# Patient Record
Sex: Female | Born: 1990 | Race: White | Hispanic: No | Marital: Single | Smoking: Former smoker
Health system: Southern US, Community
[De-identification: ages and names within clinical notes are randomized; demographics above are authoritative.]

## PROBLEM LIST (undated history)

## (undated) DIAGNOSIS — M545 Low back pain, unspecified: Secondary | ICD-10-CM

## (undated) DIAGNOSIS — G8929 Other chronic pain: Secondary | ICD-10-CM

## (undated) DIAGNOSIS — M1990 Unspecified osteoarthritis, unspecified site: Secondary | ICD-10-CM

## (undated) DIAGNOSIS — M779 Enthesopathy, unspecified: Secondary | ICD-10-CM

## (undated) HISTORY — DX: Enthesopathy, unspecified: M77.9

## (undated) HISTORY — PX: OTHER SURGICAL HISTORY: SHX169

## (undated) HISTORY — DX: Other chronic pain: G89.29

## (undated) HISTORY — DX: Low back pain: M54.5

## (undated) HISTORY — DX: Unspecified osteoarthritis, unspecified site: M19.90

## (undated) HISTORY — DX: Low back pain, unspecified: M54.50

---

## 2012-07-28 LAB — OB RESULTS CONSOLE ABO/RH: RH Type: POSITIVE

## 2012-07-28 LAB — OB RESULTS CONSOLE HEPATITIS B SURFACE ANTIGEN: Hepatitis B Surface Ag: NEGATIVE

## 2012-09-23 NOTE — L&D Delivery Note (Signed)
Attestation of Attending Supervision of Advanced Practitioner: Evaluation and management procedures were performed by the PA/NP/CNM/OB Fellow under my supervision/collaboration. Chart reviewed and agree with management and plan.  Katerin Negrete V 03/16/2013 9:50 PM

## 2012-09-23 NOTE — L&D Delivery Note (Signed)
Delivery Note At 1:18 AM a viable and healthy female was delivered via Vaginal, Spontaneous Delivery (Presentation: Middle Occiput Posterior).  No difficulty with shoulders.  APGAR: 8, 9; weight 6 lb 0.8 oz (2744 g).   Placenta status: Intact, Spontaneous.  Cord: 3 vessels with the following complications: None.   Anesthesia: Epidural  Episiotomy: None Lacerations: 1st degree Suture Repair: none needed, laceration not bleeding Est. Blood Loss (mL): 250  Mom to postpartum.  Baby to nursery-stable.  [facility] 03/14/2013, 9:47 AM

## 2012-12-02 ENCOUNTER — Inpatient Hospital Stay (HOSPITAL_COMMUNITY): Admission: AD | Admit: 2012-12-02 | Payer: Self-pay | Source: Ambulatory Visit | Admitting: Obstetrics and Gynecology

## 2012-12-28 ENCOUNTER — Encounter: Payer: Self-pay | Admitting: Obstetrics and Gynecology

## 2012-12-28 ENCOUNTER — Other Ambulatory Visit: Payer: Medicaid Other | Admitting: Obstetrics and Gynecology

## 2012-12-28 ENCOUNTER — Ambulatory Visit (INDEPENDENT_AMBULATORY_CARE_PROVIDER_SITE_OTHER): Payer: Medicaid Other | Admitting: Obstetrics and Gynecology

## 2012-12-28 VITALS — BP 112/80 | Wt 147.4 lb

## 2012-12-28 VITALS — BP 112/80

## 2012-12-28 DIAGNOSIS — F1920 Other psychoactive substance dependence, uncomplicated: Secondary | ICD-10-CM

## 2012-12-28 DIAGNOSIS — Z331 Pregnant state, incidental: Secondary | ICD-10-CM

## 2012-12-28 DIAGNOSIS — Z3402 Encounter for supervision of normal first pregnancy, second trimester: Secondary | ICD-10-CM

## 2012-12-28 DIAGNOSIS — Z1389 Encounter for screening for other disorder: Secondary | ICD-10-CM

## 2012-12-28 DIAGNOSIS — O99019 Anemia complicating pregnancy, unspecified trimester: Secondary | ICD-10-CM

## 2012-12-28 DIAGNOSIS — O99320 Drug use complicating pregnancy, unspecified trimester: Secondary | ICD-10-CM

## 2012-12-28 LAB — POCT URINALYSIS DIPSTICK
Blood, UA: NEGATIVE
Protein, UA: NEGATIVE

## 2012-12-28 LAB — CBC
Hemoglobin: 11 g/dL — ABNORMAL LOW (ref 12.0–15.0)
RBC: 3.88 MIL/uL (ref 3.87–5.11)
WBC: 10.4 10*3/uL (ref 4.0–10.5)

## 2012-12-28 NOTE — Patient Instructions (Signed)
Childbirth ses   PLEclasASE  ;)

## 2012-12-29 LAB — HIV ANTIBODY (ROUTINE TESTING W REFLEX): HIV: NONREACTIVE

## 2012-12-29 LAB — ANTIBODY SCREEN: Antibody Screen: NEGATIVE

## 2012-12-29 LAB — GLUCOSE TOLERANCE, 2 HOURS W/ 1HR
Glucose, 1 hour: 120 mg/dL (ref 70–170)
Glucose, 2 hour: 94 mg/dL (ref 70–139)
Glucose, Fasting: 74 mg/dL (ref 70–99)

## 2013-01-18 ENCOUNTER — Telehealth: Payer: Self-pay | Admitting: *Deleted

## 2013-01-18 NOTE — Telephone Encounter (Signed)
Spoke with pt. Went to [facility] and now has sun poison. Tops of feet, legs,shoulders are blistered. Went to [facility] ER last pm and was given a shot of steroids, and Tylenol with Codeine. Has had no fever. Has an appt scheduled on May 5 for follow up. Spoke with Joellyn Haff, CNM and she said May 5 appt was fine unless she started having other problems. Advised to call if anything changed. Pt voiced understanding.

## 2013-01-25 ENCOUNTER — Encounter: Payer: Self-pay | Admitting: Women's Health

## 2013-01-25 ENCOUNTER — Ambulatory Visit (INDEPENDENT_AMBULATORY_CARE_PROVIDER_SITE_OTHER): Payer: Medicaid Other | Admitting: Women's Health

## 2013-01-25 VITALS — BP 120/70 | Wt 153.0 lb

## 2013-01-25 DIAGNOSIS — Z1389 Encounter for screening for other disorder: Secondary | ICD-10-CM

## 2013-01-25 DIAGNOSIS — F1920 Other psychoactive substance dependence, uncomplicated: Secondary | ICD-10-CM

## 2013-01-25 DIAGNOSIS — Z3400 Encounter for supervision of normal first pregnancy, unspecified trimester: Secondary | ICD-10-CM | POA: Insufficient documentation

## 2013-01-25 DIAGNOSIS — L551 Sunburn of second degree: Secondary | ICD-10-CM

## 2013-01-25 DIAGNOSIS — Z331 Pregnant state, incidental: Secondary | ICD-10-CM

## 2013-01-25 DIAGNOSIS — O99019 Anemia complicating pregnancy, unspecified trimester: Secondary | ICD-10-CM

## 2013-01-25 DIAGNOSIS — Z3403 Encounter for supervision of normal first pregnancy, third trimester: Secondary | ICD-10-CM

## 2013-01-25 LAB — POCT URINALYSIS DIPSTICK
Blood, UA: NEGATIVE
Ketones, UA: NEGATIVE
Protein, UA: NEGATIVE

## 2013-01-25 MED ORDER — PREDNISONE 20 MG PO TABS: 40.0000 mg | ORAL_TABLET | Freq: Every day | ORAL | Status: DC

## 2013-01-25 MED ORDER — SILVER SULFADIAZINE 1 % EX CREA: TOPICAL_CREAM | CUTANEOUS | Status: DC | PRN

## 2013-01-25 NOTE — Progress Notes (Signed)
Reports good fm. Denies uc's, lof, vb, urinary frequency, urgency, hesitancy, or dysuria.  No complaints.  Got sun poisoning on feet 2wks ago, went to [facility] ed, given pain rx.  Bilateral feet purple w/ open blisters.  Co-exam w/ LHE, rx prednisone and silvadene cream. Reviewed PN2 labs, ptl s/s, and fetal kick counts. All questions answered. F/U 2wks

## 2013-01-25 NOTE — Progress Notes (Signed)
Had a sharp pain under left breast on Saturday, that would come and go. Had it Sunday and today, but not as bad.

## 2013-01-25 NOTE — Patient Instructions (Signed)
Pregnancy - Third Trimester  The third trimester of pregnancy (the last 3 months) is a period of the most rapid growth for you and your baby. The baby approaches a length of 20 inches and a weight of 6 to 10 pounds. The baby is adding on fat and getting ready for life outside your body. While inside, babies have periods of sleeping and waking, suck their thumbs, and hiccups. You can often feel small contractions of the uterus. This is false labor. It is also called Braxton-Hicks contractions. This is like a practice for labor. The usual problems in this stage of pregnancy include more difficulty breathing, swelling of the hands and feet from water retention, and having to urinate more often because of the uterus and baby pressing on your bladder.   PRENATAL EXAMS  · Blood work may continue to be done during prenatal exams. These tests are done to check on your health and the probable health of your baby. Blood work is used to follow your blood levels (hemoglobin). Anemia (low hemoglobin) is common during pregnancy. Iron and vitamins are given to help prevent this. You may also continue to be checked for diabetes. Some of the past blood tests may be done again.  · The size of the uterus is measured during each visit. This makes sure your baby is growing properly according to your pregnancy dates.  · Your blood pressure is checked every prenatal visit. This is to make sure you are not getting toxemia.  · Your urine is checked every prenatal visit for infection, diabetes and protein.  · Your weight is checked at each visit. This is done to make sure gains are happening at the suggested rate and that you and your baby are growing normally.  · Sometimes, an ultrasound is performed to confirm the position and the proper growth and development of the baby. This is a test done that bounces harmless sound waves off the baby so your caregiver can more accurately determine due dates.  · Discuss the type of pain medication and  anesthesia you will have during your labor and delivery.  · Discuss the possibility and anesthesia if a Cesarean Section might be necessary.  · Inform your caregiver if there is any mental or physical violence at home.  Sometimes, a specialized non-stress test, contraction stress test and biophysical profile are done to make sure the baby is not having a problem. Checking the amniotic fluid surrounding the baby is called an amniocentesis. The amniotic fluid is removed by sticking a needle into the belly (abdomen). This is sometimes done near the end of pregnancy if an early delivery is required. In this case, it is done to help make sure the baby's lungs are mature enough for the baby to live outside of the womb. If the lungs are not mature and it is unsafe to deliver the baby, an injection of cortisone medication is given to the mother 1 to 2 days before the delivery. This helps the baby's lungs mature and makes it safer to deliver the baby.  CHANGES OCCURING IN THE THIRD TRIMESTER OF PREGNANCY  Your body goes through many changes during pregnancy. They vary from person to person. Talk to your caregiver about changes you notice and are concerned about.  · During the last trimester, you have probably had an increase in your appetite. It is normal to have cravings for certain foods. This varies from person to person and pregnancy to pregnancy.  · You may begin to   get stretch marks on your hips, abdomen, and breasts. These are normal changes in the body during pregnancy. There are no exercises or medications to take which prevent this change.  · Constipation may be treated with a stool softener or adding bulk to your diet. Drinking lots of fluids, fiber in vegetables, fruits, and whole grains are helpful.  · Exercising is also helpful. If you have been very active up until your pregnancy, most of these activities can be continued during your pregnancy. If you have been less active, it is helpful to start an exercise  program such as walking. Consult your caregiver before starting exercise programs.  · Avoid all smoking, alcohol, un-prescribed drugs, herbs and "street drugs" during your pregnancy. These chemicals affect the formation and growth of the baby. Avoid chemicals throughout the pregnancy to ensure the delivery of a healthy infant.  · Backache, varicose veins and hemorrhoids may develop or get worse.  · You will tire more easily in the third trimester, which is normal.  · The baby's movements may be stronger and more often.  · You may become short of breath easily.  · Your belly button may stick out.  · A yellow discharge may leak from your breasts called colostrum.  · You may have a bloody mucus discharge. This usually occurs a few days to a week before labor begins.  HOME CARE INSTRUCTIONS   · Keep your caregiver's appointments. Follow your caregiver's instructions regarding medication use, exercise, and diet.  · During pregnancy, you are providing food for you and your baby. Continue to eat regular, well-balanced meals. Choose foods such as meat, fish, milk and other low fat dairy products, vegetables, fruits, and whole-grain breads and cereals. Your caregiver will tell you of the ideal weight gain.  · A physical sexual relationship may be continued throughout pregnancy if there are no other problems such as early (premature) leaking of amniotic fluid from the membranes, vaginal bleeding, or belly (abdominal) pain.  · Exercise regularly if there are no restrictions. Check with your caregiver if you are unsure of the safety of your exercises. Greater weight gain will occur in the last 2 trimesters of pregnancy. Exercising helps:  · Control your weight.  · Get you in shape for labor and delivery.  · You lose weight after you deliver.  · Rest a lot with legs elevated, or as needed for leg cramps or low back pain.  · Wear a good support or jogging bra for breast tenderness during pregnancy. This may help if worn during  sleep. Pads or tissues may be used in the bra if you are leaking colostrum.  · Do not use hot tubs, steam rooms, or saunas.  · Wear your seat belt when driving. This protects you and your baby if you are in an accident.  · Avoid raw meat, cat litter boxes and soil used by cats. These carry germs that can cause birth defects in the baby.  · It is easier to loose urine during pregnancy. Tightening up and strengthening the pelvic muscles will help with this problem. You can practice stopping your urination while you are going to the bathroom. These are the same muscles you need to strengthen. It is also the muscles you would use if you were trying to stop from passing gas. You can practice tightening these muscles up 10 times a set and repeating this about 3 times per day. Once you know what muscles to tighten up, do not perform these   exercises during urination. It is more likely to cause an infection by backing up the urine.  · Ask for help if you have financial, counseling or nutritional needs during pregnancy. Your caregiver will be able to offer counseling for these needs as well as refer you for other special needs.  · Make a list of emergency phone numbers and have them available.  · Plan on getting help from family or friends when you go home from the hospital.  · Make a trial run to the hospital.  · Take prenatal classes with the father to understand, practice and ask questions about the labor and delivery.  · Prepare the baby's room/nursery.  · Do not travel out of the city unless it is absolutely necessary and with the advice of your caregiver.  · Wear only low or no heal shoes to have better balance and prevent falling.  MEDICATIONS AND DRUG USE IN PREGNANCY  · Take prenatal vitamins as directed. The vitamin should contain 1 milligram of folic acid. Keep all vitamins out of reach of children. Only a couple vitamins or tablets containing iron may be fatal to a baby or young child when ingested.  · Avoid use  of all medications, including herbs, over-the-counter medications, not prescribed or suggested by your caregiver. Only take over-the-counter or prescription medicines for pain, discomfort, or fever as directed by your caregiver. Do not use aspirin, ibuprofen (Motrin®, Advil®, Nuprin®) or naproxen (Aleve®) unless OK'd by your caregiver.  · Let your caregiver also know about herbs you may be using.  · Alcohol is related to a number of birth defects. This includes fetal alcohol syndrome. All alcohol, in any form, should be avoided completely. Smoking will cause low birth rate and premature babies.  · Street/illegal drugs are very harmful to the baby. They are absolutely forbidden. A baby born to an addicted mother will be addicted at birth. The baby will go through the same withdrawal an adult does.  SEEK MEDICAL CARE IF:  You have any concerns or worries during your pregnancy. It is better to call with your questions if you feel they cannot wait, rather than worry about them.  DECISIONS ABOUT CIRCUMCISION  You may or may not know the sex of your baby. If you know your baby is a boy, it may be time to think about circumcision. Circumcision is the removal of the foreskin of the penis. This is the skin that covers the sensitive end of the penis. There is no proven medical need for this. Often this decision is made on what is popular at the time or based upon religious beliefs and social issues. You can discuss these issues with your caregiver or pediatrician.  SEEK IMMEDIATE MEDICAL CARE IF:   · An unexplained oral temperature above 102° F (38.9° C) develops, or as your caregiver suggests.  · You have leaking of fluid from the vagina (birth canal). If leaking membranes are suspected, take your temperature and tell your caregiver of this when you call.  · There is vaginal spotting, bleeding or passing clots. Tell your caregiver of the amount and how many pads are used.  · You develop a bad smelling vaginal discharge with  a change in the color from clear to white.  · You develop vomiting that lasts more than 24 hours.  · You develop chills or fever.  · You develop shortness of breath.  · You develop burning on urination.  · You loose more than 2 pounds of weight   or gain more than 2 pounds of weight or as suggested by your caregiver.  · You notice sudden swelling of your face, hands, and feet or legs.  · You develop belly (abdominal) pain. Round ligament discomfort is a common non-cancerous (benign) cause of abdominal pain in pregnancy. Your caregiver still must evaluate you.  · You develop a severe headache that does not go away.  · You develop visual problems, blurred or double vision.  · If you have not felt your baby move for more than 1 hour. If you think the baby is not moving as much as usual, eat something with sugar in it and lie down on your left side for an hour. The baby should move at least 4 to 5 times per hour. Call right away if your baby moves less than that.  · You fall, are in a car accident or any kind of trauma.  · There is mental or physical violence at home.  Document Released: 09/03/2001 Document Revised: 12/02/2011 Document Reviewed: 03/08/2009  Exit[employer]® Patient Information ©2013 [employer].

## 2013-02-08 ENCOUNTER — Ambulatory Visit (INDEPENDENT_AMBULATORY_CARE_PROVIDER_SITE_OTHER): Payer: Medicaid Other | Admitting: Women's Health

## 2013-02-08 ENCOUNTER — Encounter: Payer: Self-pay | Admitting: Women's Health

## 2013-02-08 VITALS — BP 130/90 | Ht 62.0 in | Wt 152.5 lb

## 2013-02-08 DIAGNOSIS — Z1389 Encounter for screening for other disorder: Secondary | ICD-10-CM

## 2013-02-08 DIAGNOSIS — O99019 Anemia complicating pregnancy, unspecified trimester: Secondary | ICD-10-CM

## 2013-02-08 DIAGNOSIS — F1920 Other psychoactive substance dependence, uncomplicated: Secondary | ICD-10-CM

## 2013-02-08 DIAGNOSIS — O26843 Uterine size-date discrepancy, third trimester: Secondary | ICD-10-CM

## 2013-02-08 DIAGNOSIS — O99320 Drug use complicating pregnancy, unspecified trimester: Secondary | ICD-10-CM

## 2013-02-08 DIAGNOSIS — Z331 Pregnant state, incidental: Secondary | ICD-10-CM

## 2013-02-08 LAB — POCT URINALYSIS DIPSTICK
Blood, UA: NEGATIVE
Glucose, UA: NEGATIVE
Ketones, UA: NEGATIVE

## 2013-02-08 NOTE — Patient Instructions (Signed)

## 2013-02-08 NOTE — Progress Notes (Signed)
Reports good fm. Denies uc's, lof, vb, urinary frequency, urgency, hesitancy, or dysuria.  No complaints.  Reviewed ptl s/s and fetal kicks. Sun poisoning on bilateral feet resolved- pt states 'much better!', finished w/ prednisone and silvadene cream. All questions answered. U/s asap for s<d, then 2wks for visit.

## 2013-02-09 ENCOUNTER — Ambulatory Visit (INDEPENDENT_AMBULATORY_CARE_PROVIDER_SITE_OTHER): Payer: Medicaid Other

## 2013-02-09 DIAGNOSIS — O26849 Uterine size-date discrepancy, unspecified trimester: Secondary | ICD-10-CM

## 2013-02-09 DIAGNOSIS — O26843 Uterine size-date discrepancy, third trimester: Secondary | ICD-10-CM

## 2013-02-09 NOTE — Progress Notes (Signed)
U/S(34+6wks)-vtx active fetus, EFW 4 lb 9 oz (2078gms, 16th%tile), fluid low nl AFI-6.4cm, post gr 1 plac, female fetus, Janyth Pupa")

## 2013-02-16 ENCOUNTER — Telehealth: Payer: Self-pay | Admitting: Women's Health

## 2013-02-16 ENCOUNTER — Encounter: Payer: Self-pay | Admitting: Women's Health

## 2013-02-16 ENCOUNTER — Other Ambulatory Visit: Payer: Medicaid Other | Admitting: Obstetrics & Gynecology

## 2013-02-16 DIAGNOSIS — O26843 Uterine size-date discrepancy, third trimester: Secondary | ICD-10-CM

## 2013-02-16 LAB — US OB FOLLOW UP

## 2013-02-16 NOTE — Telephone Encounter (Signed)
Left message for pt to call back to discuss results of u/s, need for 2x/wk antenatal testing Marge Duncans

## 2013-02-16 NOTE — Telephone Encounter (Signed)
Notified [facility] of u/s results and recommendation for 2x/wk testing per [facility]. No u/s appts available for Fri, so will do NST Fri and u/s afi/bpp tues. All questions answered. Marge Duncans

## 2013-02-17 ENCOUNTER — Other Ambulatory Visit: Payer: Self-pay | Admitting: Obstetrics & Gynecology

## 2013-02-17 DIAGNOSIS — O26849 Uterine size-date discrepancy, unspecified trimester: Secondary | ICD-10-CM

## 2013-02-19 ENCOUNTER — Ambulatory Visit (INDEPENDENT_AMBULATORY_CARE_PROVIDER_SITE_OTHER): Payer: Medicaid Other | Admitting: Obstetrics & Gynecology

## 2013-02-19 ENCOUNTER — Encounter: Payer: Self-pay | Admitting: Obstetrics & Gynecology

## 2013-02-19 VITALS — BP 140/90 | Wt 156.0 lb

## 2013-02-19 DIAGNOSIS — Z1389 Encounter for screening for other disorder: Secondary | ICD-10-CM

## 2013-02-19 DIAGNOSIS — Z331 Pregnant state, incidental: Secondary | ICD-10-CM

## 2013-02-19 DIAGNOSIS — O4103X0 Oligohydramnios, third trimester, not applicable or unspecified: Secondary | ICD-10-CM

## 2013-02-19 DIAGNOSIS — O0993 Supervision of high risk pregnancy, unspecified, third trimester: Secondary | ICD-10-CM

## 2013-02-19 DIAGNOSIS — O09899 Supervision of other high risk pregnancies, unspecified trimester: Secondary | ICD-10-CM

## 2013-02-19 DIAGNOSIS — O4100X0 Oligohydramnios, unspecified trimester, not applicable or unspecified: Secondary | ICD-10-CM

## 2013-02-19 LAB — POCT URINALYSIS DIPSTICK
Blood, UA: NEGATIVE
Glucose, UA: NEGATIVE

## 2013-02-19 NOTE — Progress Notes (Signed)
Some pressure, feels like baby is low.

## 2013-02-19 NOTE — Progress Notes (Signed)
Reactive NST  Sonogram on Tuesday to check fluid, BPP BP weight and urine results all reviewed and noted. Patient reports good fetal movement, denies any bleeding and no rupture of membranes symptoms or regular contractions. Patient is without complaints. All questions were answered.

## 2013-02-23 ENCOUNTER — Encounter: Payer: Self-pay | Admitting: Women's Health

## 2013-02-23 ENCOUNTER — Ambulatory Visit (INDEPENDENT_AMBULATORY_CARE_PROVIDER_SITE_OTHER): Payer: Medicaid Other

## 2013-02-23 ENCOUNTER — Ambulatory Visit (INDEPENDENT_AMBULATORY_CARE_PROVIDER_SITE_OTHER): Payer: Medicaid Other | Admitting: Women's Health

## 2013-02-23 VITALS — BP 130/80 | Wt 155.0 lb

## 2013-02-23 DIAGNOSIS — O26843 Uterine size-date discrepancy, third trimester: Secondary | ICD-10-CM

## 2013-02-23 DIAGNOSIS — Z331 Pregnant state, incidental: Secondary | ICD-10-CM

## 2013-02-23 DIAGNOSIS — O4100X0 Oligohydramnios, unspecified trimester, not applicable or unspecified: Secondary | ICD-10-CM

## 2013-02-23 DIAGNOSIS — O26849 Uterine size-date discrepancy, unspecified trimester: Secondary | ICD-10-CM

## 2013-02-23 DIAGNOSIS — O0993 Supervision of high risk pregnancy, unspecified, third trimester: Secondary | ICD-10-CM

## 2013-02-23 DIAGNOSIS — O4103X0 Oligohydramnios, third trimester, not applicable or unspecified: Secondary | ICD-10-CM

## 2013-02-23 DIAGNOSIS — Z1389 Encounter for screening for other disorder: Secondary | ICD-10-CM

## 2013-02-23 DIAGNOSIS — O09899 Supervision of other high risk pregnancies, unspecified trimester: Secondary | ICD-10-CM

## 2013-02-23 LAB — POCT URINALYSIS DIPSTICK
Blood, UA: NEGATIVE
Glucose, UA: NEGATIVE
Nitrite, UA: NEGATIVE
Protein, UA: NEGATIVE

## 2013-02-23 LAB — OB RESULTS CONSOLE GC/CHLAMYDIA: Gonorrhea: NEGATIVE

## 2013-02-23 NOTE — Patient Instructions (Addendum)

## 2013-02-23 NOTE — Progress Notes (Signed)
U/s performed. efw 5#,6 oz./11%, c/w borderline iugr, afi = 9.5 cm/25%, bpp@8 /8, vertex lie, female fetus, post. plac.

## 2013-02-23 NOTE — Progress Notes (Signed)
Reports good fm. Denies uc's, lof, vb, urinary frequency, urgency, hesitancy, or dysuria.  No complaints.  Reviewed ptl s/s, fetal kick counts, today's u/s.  GBS, GC/CH obtained today. All questions answered. F/U in Fri for NST.

## 2013-02-24 ENCOUNTER — Encounter: Payer: Medicaid Other | Admitting: Advanced Practice Midwife

## 2013-02-24 LAB — GC/CHLAMYDIA PROBE AMP: GC Probe RNA: NEGATIVE

## 2013-02-25 LAB — STREP B DNA PROBE: GBSP: NEGATIVE

## 2013-02-26 ENCOUNTER — Other Ambulatory Visit: Payer: Medicaid Other

## 2013-02-26 ENCOUNTER — Ambulatory Visit (INDEPENDENT_AMBULATORY_CARE_PROVIDER_SITE_OTHER): Payer: Medicaid Other | Admitting: Obstetrics and Gynecology

## 2013-02-26 VITALS — BP 124/82 | Wt 154.6 lb

## 2013-02-26 DIAGNOSIS — Z331 Pregnant state, incidental: Secondary | ICD-10-CM

## 2013-02-26 DIAGNOSIS — O4100X0 Oligohydramnios, unspecified trimester, not applicable or unspecified: Secondary | ICD-10-CM

## 2013-02-26 DIAGNOSIS — Z1389 Encounter for screening for other disorder: Secondary | ICD-10-CM

## 2013-02-26 DIAGNOSIS — O4100X1 Oligohydramnios, unspecified trimester, fetus 1: Secondary | ICD-10-CM

## 2013-02-26 LAB — POCT URINALYSIS DIPSTICK
Ketones, UA: NEGATIVE
Nitrite, UA: NEGATIVE
Protein, UA: NEGATIVE

## 2013-02-26 NOTE — Patient Instructions (Signed)
Fetal Movement Counts Patient Name: __________________________________________________ Patient Due Date: ____________________ Performing a fetal movement count is highly recommended in high-risk pregnancies, but it is good for every pregnant woman to do. Your caregiver may ask you to start counting fetal movements at 28 weeks of the pregnancy. Fetal movements often increase:  After eating a full meal.  After physical activity.  After eating or drinking something sweet or cold.  At rest. Pay attention to when you feel the baby is most active. This will help you notice a pattern of your baby's sleep and wake cycles and what factors contribute to an increase in fetal movement. It is important to perform a fetal movement count at the same time each day when your baby is normally most active.  HOW TO COUNT FETAL MOVEMENTS 1. Find a quiet and comfortable area to sit or lie down on your left side. Lying on your left side provides the best blood and oxygen circulation to your baby. 2. Write down the day and time on a sheet of paper or in a journal. 3. Start counting kicks, flutters, swishes, rolls, or jabs in a 2 hour period. You should feel at least 10 movements within 2 hours. 4. If you do not feel 10 movements in 2 hours, wait 2 3 hours and count again. Look for a change in the pattern or not enough counts in 2 hours. SEEK MEDICAL CARE IF:  You feel less than 10 counts in 2 hours, tried twice.  There is no movement in over an hour.  The pattern is changing or taking longer each day to reach 10 counts in 2 hours.  You feel the baby is not moving as he or she usually does. Date: ____________ Movements: ____________ Start time: ____________ Finish time: ____________  Date: ____________ Movements: ____________ Start time: ____________ Finish time: ____________ Date: ____________ Movements: ____________ Start time: ____________ Finish time: ____________ Date: ____________ Movements: ____________  Start time: ____________ Finish time: ____________ Date: ____________ Movements: ____________ Start time: ____________ Finish time: ____________ Date: ____________ Movements: ____________ Start time: ____________ Finish time: ____________ Date: ____________ Movements: ____________ Start time: ____________ Finish time: ____________ Date: ____________ Movements: ____________ Start time: ____________ Finish time: ____________  Date: ____________ Movements: ____________ Start time: ____________ Finish time: ____________ Date: ____________ Movements: ____________ Start time: ____________ Finish time: ____________ Date: ____________ Movements: ____________ Start time: ____________ Finish time: ____________ Date: ____________ Movements: ____________ Start time: ____________ Finish time: ____________ Date: ____________ Movements: ____________ Start time: ____________ Finish time: ____________ Date: ____________ Movements: ____________ Start time: ____________ Finish time: ____________ Date: ____________ Movements: ____________ Start time: ____________ Finish time: ____________  Date: ____________ Movements: ____________ Start time: ____________ Finish time: ____________ Date: ____________ Movements: ____________ Start time: ____________ Finish time: ____________ Date: ____________ Movements: ____________ Start time: ____________ Finish time: ____________ Date: ____________ Movements: ____________ Start time: ____________ Finish time: ____________ Date: ____________ Movements: ____________ Start time: ____________ Finish time: ____________ Date: ____________ Movements: ____________ Start time: ____________ Finish time: ____________ Date: ____________ Movements: ____________ Start time: ____________ Finish time: ____________  Date: ____________ Movements: ____________ Start time: ____________ Finish time: ____________ Date: ____________ Movements: ____________ Start time: ____________ Finish time:  ____________ Date: ____________ Movements: ____________ Start time: ____________ Finish time: ____________ Date: ____________ Movements: ____________ Start time: ____________ Finish time: ____________ Date: ____________ Movements: ____________ Start time: ____________ Finish time: ____________ Date: ____________ Movements: ____________ Start time: ____________ Finish time: ____________ Date: ____________ Movements: ____________ Start time: ____________ Finish time: ____________  Date: ____________ Movements: ____________ Start time: ____________ Finish   time: ____________ Date: ____________ Movements: ____________ Start time: ____________ Finish time: ____________ Date: ____________ Movements: ____________ Start time: ____________ Finish time: ____________ Date: ____________ Movements: ____________ Start time: ____________ Finish time: ____________ Date: ____________ Movements: ____________ Start time: ____________ Finish time: ____________ Date: ____________ Movements: ____________ Start time: ____________ Finish time: ____________ Date: ____________ Movements: ____________ Start time: ____________ Finish time: ____________  Date: ____________ Movements: ____________ Start time: ____________ Finish time: ____________ Date: ____________ Movements: ____________ Start time: ____________ Finish time: ____________ Date: ____________ Movements: ____________ Start time: ____________ Finish time: ____________ Date: ____________ Movements: ____________ Start time: ____________ Finish time: ____________ Date: ____________ Movements: ____________ Start time: ____________ Finish time: ____________ Date: ____________ Movements: ____________ Start time: ____________ Finish time: ____________ Date: ____________ Movements: ____________ Start time: ____________ Finish time: ____________  Date: ____________ Movements: ____________ Start time: ____________ Finish time: ____________ Date: ____________ Movements:  ____________ Start time: ____________ Finish time: ____________ Date: ____________ Movements: ____________ Start time: ____________ Finish time: ____________ Date: ____________ Movements: ____________ Start time: ____________ Finish time: ____________ Date: ____________ Movements: ____________ Start time: ____________ Finish time: ____________ Date: ____________ Movements: ____________ Start time: ____________ Finish time: ____________ Date: ____________ Movements: ____________ Start time: ____________ Finish time: ____________  Date: ____________ Movements: ____________ Start time: ____________ Finish time: ____________ Date: ____________ Movements: ____________ Start time: ____________ Finish time: ____________ Date: ____________ Movements: ____________ Start time: ____________ Finish time: ____________ Date: ____________ Movements: ____________ Start time: ____________ Finish time: ____________ Date: ____________ Movements: ____________ Start time: ____________ Finish time: ____________ Date: ____________ Movements: ____________ Start time: ____________ Finish time: ____________ Document Released: 10/09/2006 Document Revised: 08/26/2012 Document Reviewed: 07/06/2012 [employer] Patient Information 2014 [employer].  

## 2013-02-26 NOTE — Progress Notes (Signed)
NST today reactive . Ultrasound from Tuesday reviewed.  Good AF volume  9.5 cm AFI, EFW 5lb 6 oz, 11%ile. Good FM,  Will follow for now, continuing biweekly testing alternating AFI/BPP with NST(Fridays).

## 2013-02-28 ENCOUNTER — Encounter: Payer: Self-pay | Admitting: Women's Health

## 2013-03-02 ENCOUNTER — Encounter: Payer: Self-pay | Admitting: Obstetrics & Gynecology

## 2013-03-02 ENCOUNTER — Ambulatory Visit (INDEPENDENT_AMBULATORY_CARE_PROVIDER_SITE_OTHER): Payer: Medicaid Other | Admitting: Obstetrics & Gynecology

## 2013-03-02 ENCOUNTER — Other Ambulatory Visit: Payer: Self-pay | Admitting: Obstetrics & Gynecology

## 2013-03-02 ENCOUNTER — Ambulatory Visit (INDEPENDENT_AMBULATORY_CARE_PROVIDER_SITE_OTHER): Payer: Medicaid Other

## 2013-03-02 VITALS — BP 130/90 | Wt 157.0 lb

## 2013-03-02 DIAGNOSIS — O4100X0 Oligohydramnios, unspecified trimester, not applicable or unspecified: Secondary | ICD-10-CM

## 2013-03-02 DIAGNOSIS — IMO0002 Reserved for concepts with insufficient information to code with codable children: Secondary | ICD-10-CM

## 2013-03-02 DIAGNOSIS — O09899 Supervision of other high risk pregnancies, unspecified trimester: Secondary | ICD-10-CM

## 2013-03-02 DIAGNOSIS — Z1389 Encounter for screening for other disorder: Secondary | ICD-10-CM

## 2013-03-02 DIAGNOSIS — O4103X1 Oligohydramnios, third trimester, fetus 1: Secondary | ICD-10-CM

## 2013-03-02 DIAGNOSIS — O365931 Maternal care for other known or suspected poor fetal growth, third trimester, fetus 1: Secondary | ICD-10-CM

## 2013-03-02 DIAGNOSIS — Z331 Pregnant state, incidental: Secondary | ICD-10-CM

## 2013-03-02 DIAGNOSIS — O26843 Uterine size-date discrepancy, third trimester: Secondary | ICD-10-CM

## 2013-03-02 DIAGNOSIS — O365990 Maternal care for other known or suspected poor fetal growth, unspecified trimester, not applicable or unspecified: Secondary | ICD-10-CM

## 2013-03-02 LAB — POCT URINALYSIS DIPSTICK
Blood, UA: NEGATIVE
Nitrite, UA: NEGATIVE

## 2013-03-02 NOTE — Progress Notes (Signed)
U/s performed. BPP 8/8, AFI;8.3 cm/15% for 38 wks, vertex lie, post high plac, .active,

## 2013-03-02 NOTE — Progress Notes (Signed)
Sonogram noted.  AFI still stable, BPP excellent. BP weight and urine results all reviewed and noted. Patient reports good fetal movement, denies any bleeding and no rupture of membranes symptoms or regular contractions. Patient is without complaints. All questions were answered.

## 2013-03-03 LAB — US OB FOLLOW UP

## 2013-03-05 ENCOUNTER — Encounter: Payer: Self-pay | Admitting: Obstetrics and Gynecology

## 2013-03-05 ENCOUNTER — Ambulatory Visit (INDEPENDENT_AMBULATORY_CARE_PROVIDER_SITE_OTHER): Payer: Medicaid Other | Admitting: Obstetrics and Gynecology

## 2013-03-05 VITALS — BP 104/60 | Wt 157.8 lb

## 2013-03-05 DIAGNOSIS — O09899 Supervision of other high risk pregnancies, unspecified trimester: Secondary | ICD-10-CM

## 2013-03-05 DIAGNOSIS — O4103X1 Oligohydramnios, third trimester, fetus 1: Secondary | ICD-10-CM

## 2013-03-05 DIAGNOSIS — Z1389 Encounter for screening for other disorder: Secondary | ICD-10-CM

## 2013-03-05 DIAGNOSIS — Z331 Pregnant state, incidental: Secondary | ICD-10-CM

## 2013-03-05 DIAGNOSIS — O4100X0 Oligohydramnios, unspecified trimester, not applicable or unspecified: Secondary | ICD-10-CM

## 2013-03-05 LAB — POCT URINALYSIS DIPSTICK
Blood, UA: NEGATIVE
Nitrite, UA: NEGATIVE

## 2013-03-05 NOTE — Progress Notes (Signed)
Pt here today for routine visit and NST. Pt states she is having some pressure in the lower part of stomach. Pt denies any other issues at this time. Pt and Husb asked if IOL at 39 wks is mandatory if AFI remains good. Pt advised that if cx unfavorable, pt to discuss continued monitoring til 40 wk. P : cervix check at fu/ appt.

## 2013-03-08 ENCOUNTER — Other Ambulatory Visit: Payer: Self-pay | Admitting: Obstetrics & Gynecology

## 2013-03-08 DIAGNOSIS — O365931 Maternal care for other known or suspected poor fetal growth, third trimester, fetus 1: Secondary | ICD-10-CM

## 2013-03-08 DIAGNOSIS — O365991 Maternal care for other known or suspected poor fetal growth, unspecified trimester, fetus 1: Secondary | ICD-10-CM

## 2013-03-09 ENCOUNTER — Ambulatory Visit (INDEPENDENT_AMBULATORY_CARE_PROVIDER_SITE_OTHER): Payer: Medicaid Other | Admitting: Women's Health

## 2013-03-09 ENCOUNTER — Other Ambulatory Visit: Payer: Medicaid Other

## 2013-03-09 ENCOUNTER — Ambulatory Visit (INDEPENDENT_AMBULATORY_CARE_PROVIDER_SITE_OTHER): Payer: Medicaid Other

## 2013-03-09 ENCOUNTER — Other Ambulatory Visit: Payer: Self-pay | Admitting: Obstetrics & Gynecology

## 2013-03-09 VITALS — BP 138/84 | Wt 158.0 lb

## 2013-03-09 DIAGNOSIS — Z331 Pregnant state, incidental: Secondary | ICD-10-CM

## 2013-03-09 DIAGNOSIS — Z3483 Encounter for supervision of other normal pregnancy, third trimester: Secondary | ICD-10-CM

## 2013-03-09 DIAGNOSIS — O365931 Maternal care for other known or suspected poor fetal growth, third trimester, fetus 1: Secondary | ICD-10-CM

## 2013-03-09 DIAGNOSIS — Z1389 Encounter for screening for other disorder: Secondary | ICD-10-CM

## 2013-03-09 DIAGNOSIS — O26843 Uterine size-date discrepancy, third trimester: Secondary | ICD-10-CM

## 2013-03-09 DIAGNOSIS — O365990 Maternal care for other known or suspected poor fetal growth, unspecified trimester, not applicable or unspecified: Secondary | ICD-10-CM

## 2013-03-09 DIAGNOSIS — IMO0002 Reserved for concepts with insufficient information to code with codable children: Secondary | ICD-10-CM

## 2013-03-09 DIAGNOSIS — O365991 Maternal care for other known or suspected poor fetal growth, unspecified trimester, fetus 1: Secondary | ICD-10-CM

## 2013-03-09 LAB — POCT URINALYSIS DIPSTICK: Protein, UA: NEGATIVE

## 2013-03-09 NOTE — Progress Notes (Signed)
No complaints at this time.

## 2013-03-09 NOTE — Progress Notes (Signed)
Reports good fm. Denies uc's, lof, vb, urinary frequency, urgency, hesitancy, or dysuria.  No complaints.  Discussed u/s w/ LHE, IUGR <3% w/ normal AFI, dopplers, and bpp 8/8- IOL recommended. Reviewed today's u/s report w/ Brooklee & fob, recommended IOL d/t IUGR <3%- they verbalized understanding, all questions were answered. Next available IOL slot is tomorrow night at 1930. F/U in 6wks for pp visit.

## 2013-03-09 NOTE — Patient Instructions (Signed)
Labor Induction  Most women go into labor on their own between 37 and 42 weeks of the pregnancy. When this does not happen or when there is a medical need, medicine or other methods may be used to induce labor. Labor induction causes a pregnant woman's uterus to contract. It also causes the cervix to soften (ripen), open (dilate), and thin out (efface). Usually, labor is not induced before 39 weeks of the pregnancy unless there is a problem with the baby or mother. Whether your labor will be induced depends on a number of factors, including the following:  The medical condition of you and the baby.  How many weeks along you are.  The status of baby's lung maturity.  The condition of the cervix.  The position of the baby. REASONS FOR LABOR INDUCTION  The health of the baby or mother is at risk.  The pregnancy is overdue by 1 week or more.  The water breaks but labor does not start on its own.  The mother has a health condition or serious illness such as high blood pressure, infection, placental abruption, or diabetes.  The amniotic fluid amounts are low around the baby.  The baby is distressed. REASONS TO NOT INDUCE LABOR Labor induction may not be a good idea if:  It is shown that your baby does not tolerate labor.  An induction is just more convenient.  You want the baby to be born on a certain date, like a holiday.  You have had previous surgeries on your uterus, such as a myomectomy or the removal of fibroids.  Your placenta lies very low in the uterus and blocks the opening of the cervix (placenta previa).  Your baby is not in a head down position.  The umbilical cord drops down into the birth canal in front of the baby. This could cut off the baby's blood and oxygen supply.  You have had a previous cesarean delivery.  There areunusual circumstances, such as the baby being extremely premature. RISKS AND COMPLICATIONS Problems may occur in the process of induction  and plans may need to be modified as a situation unfolds. Some of the risks of induction include:  Change in fetal heart rate, such as too high, too low, or erratic.  Risk of fetal distress.  Risk of infection to mother and baby.  Increased chance of having a cesarean delivery.  The rare, but increased chance that the placenta will separate from the uterus (abruption).  Uterine rupture (very rare). When induction is needed for medical reasons, the benefits of induction may outweigh the risks. BEFORE THE PROCEDURE Your caregiver will check your cervix and the baby's position. This will help your caregiver decide if you are far enough along for an induction to work. PROCEDURE Several methods of labor induction may be used, such as:   Taking prostaglandin medicine to dilate and ripen the cervix. The medicine will also start contractions. It can be taken by mouth or by inserting a suppository into the vagina.  A thin tube (catheter) with a balloon on the end may be inserted into your vagina to dilate the cervix. Once inserted, the balloon expands with water, which causes the cervix to open.  Striping the membranes. Your caregiver inserts a finger between the cervix and membranes, which causes the cervix to be stretched and may cause the uterus to contract. This is often done during an office visit. You will be sent home to wait for the contractions to begin. You will   then come in for an induction.  Breaking the water. Your caregiver will make a hole in the amniotic sac using a small instrument. Once the amniotic sac breaks, contractions should begin. This may still take hours to see an effect.  Taking medicine to trigger or strengthen contractions. This medicine is given intravenously through a tube in your arm. All of the methods of induction, besides stripping the membranes, will be done in the hospital. Induction is done in the hospital so that you and the baby can be carefully  monitored. AFTER THE PROCEDURE Some inductions can take up to 2 or 3 days. Depending on the cervix, it usually takes less time. It takes longer when you are induced early in the pregnancy or if this is your first pregnancy. If a mother is still pregnant and the induction has been going on for 2 to 3 days, either the mother will be sent home or a cesarean delivery will be needed. Document Released: 01/29/2007 Document Revised: 12/02/2011 Document Reviewed: 07/15/2011 [employer] Patient Information 2014 [employer].  

## 2013-03-09 NOTE — Progress Notes (Signed)
U/S(38+6wks)-vtx active fetus, BPP 8/8, fluid wnl AFI=11.7cm, EFW <3rd%tile 2543gms (5lb 10 oz), UA Doppler RI-0.54 & 0.60, female fetus "Nicholas", post gr 3 plac

## 2013-03-10 ENCOUNTER — Telehealth (HOSPITAL_COMMUNITY): Payer: Self-pay | Admitting: *Deleted

## 2013-03-10 ENCOUNTER — Inpatient Hospital Stay (HOSPITAL_COMMUNITY)
Admission: RE | Admit: 2013-03-10 | Discharge: 2013-03-14 | DRG: 775 | Disposition: A | Discharge status: Home / Self Care | Payer: Medicaid Other | Source: Ambulatory Visit | Attending: Obstetrics and Gynecology | Admitting: Obstetrics and Gynecology

## 2013-03-10 ENCOUNTER — Encounter (HOSPITAL_COMMUNITY): Payer: Self-pay

## 2013-03-10 ENCOUNTER — Telehealth: Payer: Self-pay | Admitting: Advanced Practice Midwife

## 2013-03-10 ENCOUNTER — Encounter (HOSPITAL_COMMUNITY): Payer: Self-pay | Admitting: *Deleted

## 2013-03-10 VITALS — BP 122/83 | HR 90 | Temp 98.0°F | Resp 18 | Ht 62.0 in | Wt 158.0 lb

## 2013-03-10 DIAGNOSIS — Z3403 Encounter for supervision of normal first pregnancy, third trimester: Secondary | ICD-10-CM

## 2013-03-10 DIAGNOSIS — O26843 Uterine size-date discrepancy, third trimester: Secondary | ICD-10-CM

## 2013-03-10 DIAGNOSIS — Z87891 Personal history of nicotine dependence: Secondary | ICD-10-CM

## 2013-03-10 DIAGNOSIS — O365990 Maternal care for other known or suspected poor fetal growth, unspecified trimester, not applicable or unspecified: Secondary | ICD-10-CM

## 2013-03-10 LAB — CBC
Hemoglobin: 11 g/dL — ABNORMAL LOW (ref 12.0–15.0)
MCH: 28 pg (ref 26.0–34.0)
MCHC: 34.1 g/dL (ref 30.0–36.0)
RDW: 13.1 % (ref 11.5–15.5)

## 2013-03-10 MED ORDER — TERBUTALINE SULFATE 1 MG/ML IJ SOLN: 0.2500 mg | Freq: Once | INTRAMUSCULAR | Status: AC | PRN

## 2013-03-10 MED ORDER — IBUPROFEN 600 MG PO TABS
600.0000 mg | ORAL_TABLET | Freq: Four times a day (QID) | ORAL | Status: DC | PRN
  Administered 2013-03-12: 600 mg via ORAL
  Filled 2013-03-10: qty 1

## 2013-03-10 MED ORDER — LIDOCAINE HCL (PF) 1 % IJ SOLN
30.0000 mL | INTRAMUSCULAR | Status: DC | PRN
  Filled 2013-03-10 (×2): qty 30

## 2013-03-10 MED ORDER — OXYTOCIN 40 UNITS IN LACTATED RINGERS INFUSION - SIMPLE MED
62.5000 mL/h | INTRAVENOUS | Status: DC
  Administered 2013-03-12: 62.5 mL/h via INTRAVENOUS
  Filled 2013-03-10: qty 1000

## 2013-03-10 MED ORDER — ONDANSETRON HCL 4 MG/2ML IJ SOLN: 4.0000 mg | Freq: Four times a day (QID) | INTRAMUSCULAR | Status: DC | PRN

## 2013-03-10 MED ORDER — OXYTOCIN BOLUS FROM INFUSION: 500.0000 mL | INTRAVENOUS | Status: DC

## 2013-03-10 MED ORDER — ACETAMINOPHEN 325 MG PO TABS: 650.0000 mg | ORAL_TABLET | ORAL | Status: DC | PRN

## 2013-03-10 MED ORDER — OXYCODONE-ACETAMINOPHEN 5-325 MG PO TABS: 1.0000 | ORAL_TABLET | ORAL | Status: DC | PRN

## 2013-03-10 MED ORDER — OXYTOCIN 40 UNITS IN LACTATED RINGERS INFUSION - SIMPLE MED
1.0000 m[IU]/min | INTRAVENOUS | Status: DC
  Administered 2013-03-10: 1 m[IU]/min via INTRAVENOUS
  Filled 2013-03-10: qty 1000

## 2013-03-10 MED ORDER — LACTATED RINGERS IV SOLN: 500.0000 mL | INTRAVENOUS | Status: DC | PRN

## 2013-03-10 MED ORDER — CITRIC ACID-SODIUM CITRATE 334-500 MG/5ML PO SOLN: 30.0000 mL | ORAL | Status: DC | PRN

## 2013-03-10 MED ORDER — LACTATED RINGERS IV SOLN
INTRAVENOUS | Status: DC
  Administered 2013-03-10 – 2013-03-11 (×5): via INTRAVENOUS

## 2013-03-10 MED ORDER — FLEET ENEMA 7-19 GM/118ML RE ENEM: 1.0000 | ENEMA | RECTAL | Status: DC | PRN

## 2013-03-10 NOTE — Telephone Encounter (Signed)
Preadmission screen  

## 2013-03-10 NOTE — Telephone Encounter (Signed)
Spoke with pt. She only got in river thigh deep to cool off. To be induced tonight. Advised that should be fine. JSY

## 2013-03-10 NOTE — H&P (Signed)
Teresa Bass is a 22 y.o. female G1P0 [redacted]w[redacted]d who presents for induction of labor secondary to IUGR.  Patient with last ultrasound on 6/17 revealed IUGR < 3% with normal AFI.  Given above, IOL recommended.  Patient denies any vaginal bleeding, vaginal discharge, or fluid leakage.  Patient reports good fetal movement.  Patient is accompanied by the FOB.  History OB History   Grav Para Term Preterm Abortions TAB SAB Ect Mult Living   1              Past Medical History  Diagnosis Date  . Chronic low back pain     left side predominant  . Tendinitis   . Arthritis   . Chronic lower back pain    Past Surgical History  Procedure Laterality Date  . Skin graft in mouth     Family History: family history includes Cancer in her paternal grandfather and Hypertension in her paternal grandmother. Social History:  reports that she quit smoking about 7 months ago. Her smoking use included Cigarettes. She smoked 0.00 packs per day. She has never used smokeless tobacco. She reports that she does not drink alcohol or use illicit drugs.  Review of Systems  Constitutional: Negative.   Respiratory: Negative.   Cardiovascular: Negative.   Gastrointestinal: Negative.   Genitourinary: Negative.   Skin: Negative.   Neurological: Negative.     Dilation: 2 Effacement (%): 80 Station: -2 Exam by:: Dr Jerelyn Scott Blood pressure 133/83, pulse 85, temperature 98.5 F (36.9 C), temperature source Oral, resp. rate 20, height 5\' 2"  (1.575 m), weight 71.668 kg (158 lb), last menstrual period 06/05/2012. Exam Physical Exam  Constitutional: She appears well-developed and well-nourished.  HENT:  Head: Normocephalic and atraumatic.  Neck: Normal range of motion. Neck supple.  Cardiovascular: Normal rate, regular rhythm and normal heart sounds.   Respiratory: Effort normal and breath sounds normal.  GI: Soft.  Genitourinary: Vagina normal.  FHT - 150 bpm, moderate variability, + accels, no decels  present  UC - None    Prenatal labs: ABO, Rh: O/Positive/-- (11/05 0000) Antibody: NEG (04/07 0918) Rubella: Immune (11/05 0000) RPR: NON REAC (04/07 0918)  HBsAg: Negative (11/05 0000)  HIV: NON REACTIVE (04/07 0918)  GBS: NEGATIVE (06/03 1350)   Assessment/Plan: 22 y.o. female G1P0 [redacted]w[redacted]d who presents for induction of labor secondary to IUGR.  -Admit to [facility]. -IOL secondary to IUGR. -GBS negative. -Will start Pitocin for labor induction. -Expectant Management. -Anticipate NSVD.   Teresa Bass 03/10/2013, 10:04 PM  Clinic:[facility]  Genetic Screen NT/IT: neg                              Anatomic [place] normal  Glucose Screen 2hr normal: 74/120/94  GBS neg  Feeding Preference breast  Contraception undecided  Circumcision Yes, if boy   Patient Active Problem List   Diagnosis Date Noted  . IUGR (intrauterine growth restriction) 03/02/2013  . Uterine size-date discrepancy in third trimester 02/16/2013  . Supervision of normal first pregnancy 01/25/2013   I was present for the exam and agree with above.  Dilation: 3 Effacement (%): 70 Cervical Position: Posterior Station: -1 Presentation: Vertex Exam by:: v. Arihana Ambrocio cnm Will start Pitocin.  [place], CNM 03/10/2013 10:45 PM

## 2013-03-11 ENCOUNTER — Encounter (HOSPITAL_COMMUNITY): Payer: Self-pay | Admitting: Anesthesiology

## 2013-03-11 ENCOUNTER — Inpatient Hospital Stay (HOSPITAL_COMMUNITY): Payer: Medicaid Other | Admitting: Anesthesiology

## 2013-03-11 ENCOUNTER — Encounter (HOSPITAL_COMMUNITY): Payer: Self-pay

## 2013-03-11 LAB — CBC
MCH: 27.5 pg (ref 26.0–34.0)
MCV: 81.3 fL (ref 78.0–100.0)
Platelets: 199 10*3/uL (ref 150–400)
RBC: 4.22 MIL/uL (ref 3.87–5.11)
RDW: 13 % (ref 11.5–15.5)
WBC: 17.2 10*3/uL — ABNORMAL HIGH (ref 4.0–10.5)

## 2013-03-11 LAB — COMPREHENSIVE METABOLIC PANEL
ALT: 11 U/L (ref 0–35)
AST: 15 U/L (ref 0–37)
Albumin: 3 g/dL — ABNORMAL LOW (ref 3.5–5.2)
CO2: 20 mEq/L (ref 19–32)
Calcium: 8.8 mg/dL (ref 8.4–10.5)
Chloride: 97 mEq/L (ref 96–112)
Creatinine, Ser: 0.57 mg/dL (ref 0.50–1.10)
Sodium: 131 mEq/L — ABNORMAL LOW (ref 135–145)

## 2013-03-11 LAB — RPR: RPR Ser Ql: NONREACTIVE

## 2013-03-11 MED ORDER — PHENYLEPHRINE 40 MCG/ML (10ML) SYRINGE FOR IV PUSH (FOR BLOOD PRESSURE SUPPORT)
80.0000 ug | PREFILLED_SYRINGE | INTRAVENOUS | Status: DC | PRN
  Filled 2013-03-11: qty 2

## 2013-03-11 MED ORDER — BUTORPHANOL TARTRATE 1 MG/ML IJ SOLN
1.0000 mg | Freq: Once | INTRAMUSCULAR | Status: AC
  Administered 2013-03-11: 1 mg via INTRAVENOUS
  Filled 2013-03-11: qty 1

## 2013-03-11 MED ORDER — PHENYLEPHRINE 40 MCG/ML (10ML) SYRINGE FOR IV PUSH (FOR BLOOD PRESSURE SUPPORT)
80.0000 ug | PREFILLED_SYRINGE | INTRAVENOUS | Status: DC | PRN
  Filled 2013-03-11: qty 2
  Filled 2013-03-11: qty 5

## 2013-03-11 MED ORDER — LACTATED RINGERS IV SOLN
500.0000 mL | Freq: Once | INTRAVENOUS | Status: AC
  Administered 2013-03-11: 1000 mL via INTRAVENOUS

## 2013-03-11 MED ORDER — FENTANYL 2.5 MCG/ML BUPIVACAINE 1/10 % EPIDURAL INFUSION (WH - ANES)
14.0000 mL/h | INTRAMUSCULAR | Status: DC | PRN
  Administered 2013-03-11: 14 mL/h via EPIDURAL
  Filled 2013-03-11: qty 125

## 2013-03-11 MED ORDER — FENTANYL CITRATE 0.05 MG/ML IJ SOLN
50.0000 ug | INTRAMUSCULAR | Status: DC | PRN
  Administered 2013-03-11 (×2): 50 ug via INTRAVENOUS
  Filled 2013-03-11 (×3): qty 2

## 2013-03-11 MED ORDER — BUPIVACAINE HCL (PF) 0.25 % IJ SOLN
INTRAMUSCULAR | Status: DC | PRN
  Administered 2013-03-11: 5 mL via EPIDURAL

## 2013-03-11 MED ORDER — FENTANYL 2.5 MCG/ML BUPIVACAINE 1/10 % EPIDURAL INFUSION (WH - ANES)
INTRAMUSCULAR | Status: AC
  Filled 2013-03-11: qty 125

## 2013-03-11 MED ORDER — DIPHENHYDRAMINE HCL 50 MG/ML IJ SOLN: 12.5000 mg | INTRAMUSCULAR | Status: DC | PRN

## 2013-03-11 MED ORDER — FENTANYL CITRATE 0.05 MG/ML IJ SOLN
100.0000 ug | Freq: Once | INTRAMUSCULAR | Status: AC
  Administered 2013-03-11: 100 ug via EPIDURAL

## 2013-03-11 MED ORDER — FENTANYL 2.5 MCG/ML BUPIVACAINE 1/10 % EPIDURAL INFUSION (WH - ANES)
INTRAMUSCULAR | Status: DC | PRN
  Administered 2013-03-11: 13 mL/h via EPIDURAL

## 2013-03-11 MED ORDER — PHENYLEPHRINE 40 MCG/ML (10ML) SYRINGE FOR IV PUSH (FOR BLOOD PRESSURE SUPPORT)
PREFILLED_SYRINGE | INTRAVENOUS | Status: AC
  Filled 2013-03-11: qty 5

## 2013-03-11 MED ORDER — LIDOCAINE HCL (PF) 1 % IJ SOLN
INTRAMUSCULAR | Status: DC | PRN
  Administered 2013-03-11 (×2): 4 mL

## 2013-03-11 MED ORDER — EPHEDRINE 5 MG/ML INJ
10.0000 mg | INTRAVENOUS | Status: DC | PRN
  Filled 2013-03-11: qty 4
  Filled 2013-03-11: qty 2

## 2013-03-11 MED ORDER — EPHEDRINE 5 MG/ML INJ
INTRAVENOUS | Status: AC
  Filled 2013-03-11: qty 4

## 2013-03-11 MED ORDER — EPHEDRINE 5 MG/ML INJ
10.0000 mg | INTRAVENOUS | Status: DC | PRN
  Filled 2013-03-11: qty 2

## 2013-03-11 NOTE — Progress Notes (Addendum)
   Subjective: Reports increase in pain.  Requesting additional pain medication.  Denies headache, vision changes or epigastric pain.   Objective: BP 146/100  Pulse 69  Temp(Src) 97.9 F (36.6 C) (Oral)  Resp 20  Ht 5\' 2"  (1.575 m)  Wt 71.668 kg (158 lb)  BMI 28.89 kg/m2  LMP 06/05/2012      FHT:  FHR: 130's bpm, variability: moderate,  accelerations:  Present,  decelerations:  Absent UC:   Poor tracing, monitor readjusted SVE:   Dilation: 4 Effacement (%): 70 Station: -2 Exam by:: Roney Marion, CNM  Labs: Lab Results  Component Value Date   WBC 12.0* 03/10/2013   HGB 11.0* 03/10/2013   HCT 32.3* 03/10/2013   MCV 82.2 03/10/2013   PLT 222 03/10/2013    Assessment / Plan: Augmentation of labor, progressing well  Labor: Progressing normally Preeclampsia:  n/a; check labs due to rising blood pressure.   Fetal Wellbeing:  Category I Pain Control:  Fentanyl I/D:  GBS neg Anticipated MOD:  NSVD Order Stadol IV Check CBC/CMP  [facility] 03/11/2013, 2:00 PM

## 2013-03-11 NOTE — H&P (Signed)
Attestation of Attending Supervision of Advanced Practitioner: Evaluation and management procedures were performed by the PA/NP/CNM/OB Fellow under my supervision/collaboration. Chart reviewed and agree with management and plan.  Mayuri Staples V 03/11/2013 10:15 AM

## 2013-03-11 NOTE — Anesthesia Preprocedure Evaluation (Signed)
Anesthesia Evaluation  Patient identified by MRN, date of birth, ID band Patient awake    Reviewed: Allergy & Precautions, H&P , Patient's Chart, lab work & pertinent test results  Airway Mallampati: II TM Distance: >3 FB Neck ROM: full    Dental no notable dental hx. (+) Teeth Intact   Pulmonary neg pulmonary ROS,  breath sounds clear to auscultation  Pulmonary exam normal       Cardiovascular negative cardio ROS  Rhythm:regular Rate:Normal     Neuro/Psych  Neuromuscular disease negative neurological ROS  negative psych ROS   GI/Hepatic negative GI ROS, Neg liver ROS,   Endo/Other  negative endocrine ROS  Renal/GU negative Renal ROS  negative genitourinary   Musculoskeletal  (+) Arthritis -,   Abdominal Normal abdominal exam  (+)   Peds  Hematology negative hematology ROS (+)   Anesthesia Other Findings   Reproductive/Obstetrics (+) Pregnancy IUGR                           Anesthesia Physical Anesthesia Plan  ASA: II  Anesthesia Plan: Epidural   Post-op Pain Management:    Induction:   Airway Management Planned:   Additional Equipment:   Intra-op Plan:   Post-operative Plan:   Informed Consent: I have reviewed the patients History and Physical, chart, labs and discussed the procedure including the risks, benefits and alternatives for the proposed anesthesia with the patient or authorized representative who has indicated his/her understanding and acceptance.     Plan Discussed with: Anesthesiologist  Anesthesia Plan Comments:         Anesthesia Quick Evaluation

## 2013-03-11 NOTE — Progress Notes (Signed)
Teresa Bass is a 22 y.o. G1P0 at [redacted]w[redacted]d who presents for IOL secondary to IUGR.    Subjective: Patient comfortable.  Patient plans to attempt to rest for the remainder of the evening.   Objective: BP 125/79   Pulse 72   Temp(Src) 98 F (36.7 C) (Oral)   Resp 18   Ht 5\' 2"  (1.575 m)   Wt 71.668 kg (158 lb)   BMI 28.89 kg/m2   LMP 06/05/2012      FHT:  FHR: 130 bpm, variability: moderate,  accelerations:  Present,  decelerations:  Absent UC:   Irregular  Labs: Lab Results  Component Value Date   WBC 12.0* 03/10/2013   HGB 11.0* 03/10/2013   HCT 32.3* 03/10/2013   MCV 82.2 03/10/2013   PLT 222 03/10/2013    Assessment / Plan: Induction of labor due to IUGR,  progressing well on pitocin  Labor: Progressing normally Preeclampsia:  None Fetal Wellbeing:  Category I Pain Control:  Labor support without medications I/D:  GBS negative Anticipated MOD:  NSVD  Holly Stegall 03/11/2013, 2:04 AM

## 2013-03-11 NOTE — Progress Notes (Signed)
Teresa Bass is a 22 y.o. G1P0 at [redacted]w[redacted]d by ultrasound admitted for induction of labor due to Poor fetal growth.  Subjective: Feeling some pressure  Objective: BP 126/64  Pulse 85  Temp(Src) 98.8 F (37.1 C) (Oral)  Resp 20  Ht 5\' 2"  (1.575 m)  Wt 71.668 kg (158 lb)  BMI 28.89 kg/m2  SpO2 99%  LMP 06/05/2012 I/O last 3 completed shifts: In: -  Out: 150 [Urine:150] Total I/O In: -  Out: 300 [Urine:300]  FHT:  FHR: 140 bpm, variability: moderate,  accelerations:  Present,  decelerations:  Present variable UC:   regular, every 2 minutes SVE:   Dilation: 10 Effacement (%): 100 Station: +1 Exam by:: [employer] RN  Labs: Lab Results  Component Value Date   WBC 17.2* 03/11/2013   HGB 11.6* 03/11/2013   HCT 34.3* 03/11/2013   MCV 81.3 03/11/2013   PLT 199 03/11/2013    Assessment / Plan: Induction of labor due to IUGR,  progressing well on pitocin  Labor: Progressing normally Preeclampsia:  n/a Fetal Wellbeing:  Category I Pain Control:  Epidural I/D:  n/a Anticipated MOD:  NSVD  Teresa Bass 03/11/2013, 11:04 PM

## 2013-03-11 NOTE — Progress Notes (Signed)
Teresa Bass is a 22 y.o. G1P0 at [redacted]w[redacted]d who presents for IOL secondary to IUGR.    Subjective: Patient comfortable currently.    Objective: BP 125/91  Pulse 73  Temp(Src) 98.2 F (36.8 C) (Oral)  Resp 18  Ht 5\' 2"  (1.575 m)  Wt 71.668 kg (158 lb)  BMI 28.89 kg/m2  LMP 06/05/2012      FHT:  FHR: 150 bpm, variability: moderate,  accelerations:  Present,  decelerations:  Absent UC:  Every 1.5 - 4 mins, irregular Dilation: 3 Effacement (%): 70 Cervical Position: Posterior Station: -2 Presentation: Vertex Exam by:: Dr. Adriana Simas  Labs: Lab Results  Component Value Date   WBC 12.0* 03/10/2013   HGB 11.0* 03/10/2013   HCT 32.3* 03/10/2013   MCV 82.2 03/10/2013   PLT 222 03/10/2013    Assessment / Plan: Induction of labor due to IUGR,  progressing well on pitocin  Labor: Progressing normally Preeclampsia:  None Fetal Wellbeing:  Category I Pain Control:  Labor support without medications I/D:  GBS negative Anticipated MOD:  NSVD  Everlene Other 03/11/2013, 10:20 AM

## 2013-03-11 NOTE — Progress Notes (Signed)
Doing well, Epidural working better, back pain improved  FHR stable and reassuring UCs every 2 min  Cervix deferred  Await second stage

## 2013-03-11 NOTE — Anesthesia Procedure Notes (Signed)
Epidural Patient location during procedure: OB Start time: 03/11/2013 4:10 PM  Staffing Anesthesiologist: Jordany Russett A. Performed by: anesthesiologist   Preanesthetic Checklist Completed: patient identified, site marked, surgical consent, pre-op evaluation, timeout performed, IV checked, risks and benefits discussed and monitors and equipment checked  Epidural Patient position: sitting Prep: site prepped and draped and DuraPrep Patient monitoring: continuous pulse ox and blood pressure Approach: midline Injection technique: LOR air  Needle:  Needle type: Tuohy  Needle gauge: 17 G Needle length: 9 cm and 9 Needle insertion depth: 4 cm Catheter type: closed end flexible Catheter size: 19 Gauge Catheter at skin depth: 9 cm Test dose: negative and Other  Assessment Events: blood not aspirated, injection not painful, no injection resistance, negative IV test and no paresthesia  Additional Notes Patient identified. Risks and benefits discussed including failed block, incomplete  Pain control, post dural puncture headache, nerve damage, paralysis, blood pressure Changes, nausea, vomiting, reactions to medications-both toxic and allergic and post Partum back pain. All questions were answered. Patient expressed understanding and wished to proceed. Sterile technique was used throughout procedure. Epidural site was Dressed with sterile barrier dressing. No paresthesias, signs of intravascular injection Or signs of intrathecal spread were encountered.  Patient was more comfortable after the epidural was dosed. Please see RN's note for documentation of vital signs and FHR which are stable.

## 2013-03-12 ENCOUNTER — Other Ambulatory Visit: Payer: Medicaid Other | Admitting: Obstetrics & Gynecology

## 2013-03-12 ENCOUNTER — Other Ambulatory Visit: Payer: Medicaid Other

## 2013-03-12 ENCOUNTER — Encounter (HOSPITAL_COMMUNITY): Payer: Self-pay

## 2013-03-12 MED ORDER — BENZOCAINE-MENTHOL 20-0.5 % EX AERO
1.0000 "application " | INHALATION_SPRAY | CUTANEOUS | Status: DC | PRN
  Filled 2013-03-12: qty 56

## 2013-03-12 MED ORDER — DIBUCAINE 1 % RE OINT: 1.0000 "application " | TOPICAL_OINTMENT | RECTAL | Status: DC | PRN

## 2013-03-12 MED ORDER — SIMETHICONE 80 MG PO CHEW: 80.0000 mg | CHEWABLE_TABLET | ORAL | Status: DC | PRN

## 2013-03-12 MED ORDER — ZOLPIDEM TARTRATE 5 MG PO TABS: 5.0000 mg | ORAL_TABLET | Freq: Every evening | ORAL | Status: DC | PRN

## 2013-03-12 MED ORDER — PRENATAL MULTIVITAMIN CH
1.0000 | ORAL_TABLET | Freq: Every day | ORAL | Status: DC
  Administered 2013-03-12 – 2013-03-13 (×2): 1 via ORAL
  Filled 2013-03-12 (×2): qty 1

## 2013-03-12 MED ORDER — WITCH HAZEL-GLYCERIN EX PADS
1.0000 "application " | MEDICATED_PAD | CUTANEOUS | Status: DC | PRN
  Administered 2013-03-12: 1 via TOPICAL

## 2013-03-12 MED ORDER — TETANUS-DIPHTH-ACELL PERTUSSIS 5-2.5-18.5 LF-MCG/0.5 IM SUSP: 0.5000 mL | Freq: Once | INTRAMUSCULAR | Status: DC

## 2013-03-12 MED ORDER — OXYCODONE-ACETAMINOPHEN 5-325 MG PO TABS
1.0000 | ORAL_TABLET | ORAL | Status: DC | PRN
  Administered 2013-03-12 – 2013-03-13 (×2): 1 via ORAL
  Filled 2013-03-12 (×2): qty 1

## 2013-03-12 MED ORDER — SENNOSIDES-DOCUSATE SODIUM 8.6-50 MG PO TABS
2.0000 | ORAL_TABLET | Freq: Every day | ORAL | Status: DC
  Administered 2013-03-12 – 2013-03-13 (×2): 2 via ORAL

## 2013-03-12 MED ORDER — DIPHENHYDRAMINE HCL 25 MG PO CAPS: 25.0000 mg | ORAL_CAPSULE | Freq: Four times a day (QID) | ORAL | Status: DC | PRN

## 2013-03-12 MED ORDER — ONDANSETRON HCL 4 MG/2ML IJ SOLN: 4.0000 mg | INTRAMUSCULAR | Status: DC | PRN

## 2013-03-12 MED ORDER — ONDANSETRON HCL 4 MG PO TABS: 4.0000 mg | ORAL_TABLET | ORAL | Status: DC | PRN

## 2013-03-12 MED ORDER — LANOLIN HYDROUS EX OINT: TOPICAL_OINTMENT | CUTANEOUS | Status: DC | PRN

## 2013-03-12 MED ORDER — IBUPROFEN 600 MG PO TABS
600.0000 mg | ORAL_TABLET | Freq: Four times a day (QID) | ORAL | Status: DC
  Administered 2013-03-12 – 2013-03-14 (×8): 600 mg via ORAL
  Filled 2013-03-12 (×8): qty 1

## 2013-03-12 NOTE — Progress Notes (Signed)
UR completed 

## 2013-03-12 NOTE — Anesthesia Postprocedure Evaluation (Signed)
Anesthesia Post Note  Patient: Teresa Bass  Procedure(s) Performed: * No procedures listed *  Anesthesia type: Epidural  Patient location: Mother/Baby  Post pain: Pain level controlled  Post assessment: Post-op Vital signs reviewed  Last Vitals:  Filed Vitals:   03/12/13 0356  BP: 129/84  Pulse: 96  Temp: 37.1 C  Resp: 18    Post vital signs: Reviewed  Level of consciousness: awake  Complications: No apparent anesthesia complications

## 2013-03-13 NOTE — Lactation Note (Signed)
This note was copied from the chart of Teresa Kanai Sassaman. Lactation Consultation Note Mom had baby latched on right side lying with RN's assistance when I enter room. Mom states she is comfortable. Baby with wide latch, maintains latch with gulping. Mom states this feeding very much improved from previous feedings.  Mom states she does not have any other concerns or questions. Enc mom to call if she needs help with next feeding; direct number provided.   Patient Name: Teresa Bass [id number] Date: 03/13/2013 Reason for consult: Follow-up assessment   Maternal Data    Feeding Feeding Type: Breast Milk Feeding method: Breast Length of feed: 20 min  LATCH Score/Interventions Latch: Grasps breast easily, tongue down, lips flanged, rhythmical sucking. Intervention(s): Skin to skin;Teach feeding cues Intervention(s): Assist with latch  Audible Swallowing: Spontaneous and intermittent Intervention(s): Skin to skin Intervention(s): Hand expression  Type of Nipple: Flat Intervention(s): Reverse pressure;Double electric pump  Comfort (Breast/Nipple): Soft / non-tender  Problem noted: Mild/Moderate discomfort Interventions (Mild/moderate discomfort): Hand massage;Hand expression  Hold (Positioning): Assistance needed to correctly position infant at breast and maintain latch. Intervention(s): Support Pillows  LATCH Score: 5  Lactation Tools Discussed/Used     Consult Status Consult Status: Follow-up Follow-up type: In-patient    Octavio Manns [facility] 03/13/2013, 12:41 PM

## 2013-03-13 NOTE — Progress Notes (Signed)
Post Partum Day 1 Subjective: up ad lib, tolerating PO and breastfeeding, having difficulty, desires discharge tomorrow for additonal breastfeeding help.  Objective: Blood pressure 142/89, pulse 67, temperature 97.9 F (36.6 C), temperature source Oral, resp. rate 18, height 5\' 2"  (1.575 m), weight 71.668 kg (158 lb), last menstrual period 06/05/2012, SpO2 100.00%, unknown if currently breastfeeding.  Physical Exam:  General: alert, cooperative and appears stated age Lochia: appropriate Uterine Fundus: firm Incision: n/a DVT Evaluation: No evidence of DVT seen on physical exam. Negative Homan's sign.   Recent Labs  03/10/13 2030 03/11/13 1512  HGB 11.0* 11.6*  HCT 32.3* 34.3*    Assessment/Plan: Plan for discharge tomorrow   LOS: 3 days   [facility] 03/13/2013, 7:08 AM

## 2013-03-14 MED ORDER — IBUPROFEN 600 MG PO TABS: 600.0000 mg | ORAL_TABLET | Freq: Four times a day (QID) | ORAL | Status: DC | PRN

## 2013-03-14 NOTE — Lactation Note (Signed)
This note was copied from the chart of Teresa Bass. Lactation Consultation Note  Patient Name: Teresa Bass Date: 03/14/2013 Reason for consult: Follow-up assessment   Maternal Data    Feeding    LATCH Score/Interventions                      Lactation Tools Discussed/Used     Consult Status Consult Status: Complete  Follow up visit with mom. She reports that baby fed about 1 hour ago and he is asleep beside her. She reports that baby is latching much better and she is not using the NS now. No questions at present. To call prn.   Pamelia Hoit 03/14/2013, 9:21 AM

## 2013-03-14 NOTE — Discharge Summary (Signed)
Obstetric Discharge Summary Reason for Admission: induction of labor d/t IUGR 3% Prenatal Procedures: NST and ultrasound Intrapartum Procedures: spontaneous vaginal delivery Postpartum Procedures: none Complications-Operative and Postpartum: 1st degree perineal laceration Hemoglobin  Date Value Range Status  03/11/2013 11.6* 12.0 - 15.0 g/dL Final     HCT  Date Value Range Status  03/11/2013 34.3* 36.0 - 46.0 % Final    Physical Exam:  General: alert, cooperative and no distress Lochia: appropriate Uterine Fundus: firm Incision: n/a DVT Evaluation: No evidence of DVT seen on physical exam. Negative Homan's sign. No cords or calf tenderness. No significant calf/ankle edema.  Discharge Diagnoses: Term Pregnancy-delivered  Discharge Information: Date: 03/14/2013 Activity: pelvic rest Diet: routine Medications: PNV and Ibuprofen Condition: stable Instructions: refer to practice specific booklet Discharge to: home Follow-up Information   Follow up with [facility] In 6 weeks. (as scheduled for your postpartum visit)    Contact information:   [address] [place] [address] [phone]      Newborn Data: Live born female  Birth Weight: 6 lb 0.8 oz (2744 g) APGAR: 8, 9  Home with mother. Breastfeeding well Plans for micronor for contraception Circumcision at FT  Marge Duncans 03/14/2013, 7:40 AM

## 2013-03-24 ENCOUNTER — Telehealth: Payer: Self-pay | Admitting: *Deleted

## 2013-03-24 NOTE — Telephone Encounter (Signed)
Lucy Antigua from [facility] called. Pt had a vaginal delivery on 03/12/13. BP today was 120/80 and 120/90. Complained of headache on 2 separate days. No shortness of breath, no dizziness, no chestpain, no edema. Spoke with JAG, NP. Advised everything sounds fine unless pt is feeling bad.

## 2013-03-24 NOTE — Telephone Encounter (Signed)
Spoke with pt. She don't feel like she needs to be seen. Hasn't had eyes checked in over 2 years, but had that done Monday, and lens was changed. Advised if pt feels like she needs to be seen, feel free to call and we would make her an appt. Pt voiced understanding. JSY

## 2013-04-20 ENCOUNTER — Encounter: Payer: Self-pay | Admitting: Obstetrics and Gynecology

## 2013-04-20 ENCOUNTER — Ambulatory Visit (INDEPENDENT_AMBULATORY_CARE_PROVIDER_SITE_OTHER): Payer: Medicaid Other | Admitting: Obstetrics and Gynecology

## 2013-04-20 VITALS — BP 118/84 | Ht 62.0 in | Wt 135.6 lb

## 2013-04-20 DIAGNOSIS — Z309 Encounter for contraceptive management, unspecified: Secondary | ICD-10-CM

## 2013-04-20 DIAGNOSIS — Z3202 Encounter for pregnancy test, result negative: Secondary | ICD-10-CM

## 2013-04-20 LAB — POCT URINE PREGNANCY: Preg Test, Ur: NEGATIVE

## 2013-04-20 MED ORDER — NORETHINDRONE 0.35 MG PO TABS: 1.0000 | ORAL_TABLET | Freq: Every day | ORAL | Status: DC

## 2013-04-20 NOTE — Progress Notes (Signed)
  Assessment:  1. Normal postpartum exam.    Plan:  1. Begin POP. Micronor  Subjective:  Teresa Bass is a 22 y.o. female who presents for a postpartum visit.  uncomlicated svd with 1deg lac'n.    Patient is not sexually active.   The following portions of the patient's history were reviewed and updated as appropriate: allergies, current medications, past family history, past medical history, past surgical history and problem list.  Review of Systems See Subjective, otherwise negative ROS.  Objective:  BP 118/84  Ht 5\' 2"  (1.575 m)  Wt 135 lb 9.6 oz (61.508 kg)  BMI 24.8 kg/m2  Breastfeeding? Yes  General:  alert, cooperative and no distress     Lungs: clear to auscultation bilaterally  Heart:  regular rate and rhythm, S1, S2 normal, no murmur  Abdomen: soft, non-tender; bowel sounds normal; no masses,  no organomegaly   Vulva:  normal  Vagina: normal vagina  Cervix:  normal  Corpus: normal size, contour, position, consistency, mobility, non-tender  Adnexa:  normal adnexa

## 2013-04-20 NOTE — Addendum Note (Signed)
Addended by: Tilda Burrow on: 04/20/2013 05:32 PM   Modules accepted: Orders

## 2013-08-12 ENCOUNTER — Encounter (HOSPITAL_COMMUNITY): Payer: Self-pay | Admitting: Emergency Medicine

## 2013-08-12 ENCOUNTER — Emergency Department (HOSPITAL_COMMUNITY)
Admission: EM | Admit: 2013-08-12 | Discharge: 2013-08-12 | Disposition: A | Discharge status: Home / Self Care | Payer: Medicaid Other | Attending: Emergency Medicine | Admitting: Emergency Medicine

## 2013-08-12 DIAGNOSIS — Z87440 Personal history of urinary (tract) infections: Secondary | ICD-10-CM | POA: Insufficient documentation

## 2013-08-12 DIAGNOSIS — Z3202 Encounter for pregnancy test, result negative: Secondary | ICD-10-CM | POA: Insufficient documentation

## 2013-08-12 DIAGNOSIS — G8929 Other chronic pain: Secondary | ICD-10-CM | POA: Insufficient documentation

## 2013-08-12 DIAGNOSIS — Z79899 Other long term (current) drug therapy: Secondary | ICD-10-CM | POA: Insufficient documentation

## 2013-08-12 DIAGNOSIS — R109 Unspecified abdominal pain: Secondary | ICD-10-CM | POA: Insufficient documentation

## 2013-08-12 DIAGNOSIS — Z8739 Personal history of other diseases of the musculoskeletal system and connective tissue: Secondary | ICD-10-CM | POA: Insufficient documentation

## 2013-08-12 DIAGNOSIS — F17200 Nicotine dependence, unspecified, uncomplicated: Secondary | ICD-10-CM | POA: Insufficient documentation

## 2013-08-12 DIAGNOSIS — R52 Pain, unspecified: Secondary | ICD-10-CM | POA: Insufficient documentation

## 2013-08-12 LAB — URINALYSIS, ROUTINE W REFLEX MICROSCOPIC
Bilirubin Urine: NEGATIVE
Glucose, UA: NEGATIVE mg/dL
Ketones, ur: NEGATIVE mg/dL
Specific Gravity, Urine: 1.025 (ref 1.005–1.030)
pH: 6 (ref 5.0–8.0)

## 2013-08-12 LAB — URINE MICROSCOPIC-ADD ON

## 2013-08-12 MED ORDER — NITROFURANTOIN MONOHYD MACRO 100 MG PO CAPS: 100.0000 mg | ORAL_CAPSULE | Freq: Two times a day (BID) | ORAL | Status: AC

## 2013-08-12 MED ORDER — NITROFURANTOIN MONOHYD MACRO 100 MG PO CAPS
100.0000 mg | ORAL_CAPSULE | Freq: Once | ORAL | Status: AC
  Administered 2013-08-12: 100 mg via ORAL
  Filled 2013-08-12: qty 1

## 2013-08-12 NOTE — ED Notes (Signed)
Patient given discharge instruction, verbalized understand. IV removed, band aid applied. Patient ambulatory out of the department.  

## 2013-08-12 NOTE — ED Notes (Signed)
Left flank pain x 3 days.  Denies hematuria, denies n/v.  Denies urinary burning or frequency.

## 2013-08-12 NOTE — ED Provider Notes (Signed)
CSN: [id number]     Arrival date & time 08/12/13  1328 History   First MD Initiated Contact with Patient 08/12/13 1402     Chief Complaint  Patient presents with  . Flank Pain   (Consider location/radiation/quality/duration/timing/severity/associated sxs/prior Treatment) HPI This patient presents with concern of left flank pain.  Pain began insidiously 3 days ago.  Since onset there has been persistent, with minimal changes in characteristics.  Pain is sore, nonradiating.  There is no associated nausea, vomiting, dysuria, polyuria, any other focal complaints. Patient states that these symptoms are similar to those that she experienced during a prior UTI. Past Medical History  Diagnosis Date  . Chronic low back pain     left side predominant  . Tendinitis   . Arthritis   . Chronic lower back pain    Past Surgical History  Procedure Laterality Date  . Skin graft in mouth     Family History  Problem Relation Age of Onset  . Cancer Paternal Grandfather     skin cancer  . Hypertension Paternal Grandmother    History  Substance Use Topics  . Smoking status: Current Every Day Smoker    Types: Cigarettes  . Smokeless tobacco: Never Used  . Alcohol Use: No   OB History   Grav Para Term Preterm Abortions TAB SAB Ect Mult Living   1 1 1       1      Review of Systems  All other systems reviewed and are negative.    Allergies  Other  Home Medications   Current Outpatient Rx  Name  Route  Sig  Dispense  Refill  . norethindrone (MICRONOR,CAMILA,ERRIN) 0.35 MG tablet   Oral   Take 1 tablet (0.35 mg total) by mouth daily.   1 Package   11    BP 133/91  Pulse 106  Temp(Src) 98.1 F (36.7 C) (Oral)  Resp 16  Ht 5\' 2"  (1.575 m)  Wt 135 lb (61.236 kg)  BMI 24.69 kg/m2  SpO2 100%  Breastfeeding? Yes Physical Exam  Nursing note and vitals reviewed. Constitutional: She is oriented to person, place, and time. She appears well-developed and well-nourished. No distress.   HENT:  Head: Normocephalic and atraumatic.  Eyes: Conjunctivae and EOM are normal.  Cardiovascular: Normal rate and regular rhythm.   Pulmonary/Chest: No stridor. No respiratory distress.  Abdominal: She exhibits no distension. There is no tenderness. There is no rebound and no guarding.  Patient localizes pain to the left CVA area, with no guarding, no notable deformity  Musculoskeletal: She exhibits no edema.  Neurological: She is alert and oriented to person, place, and time. No cranial nerve deficit.  Skin: Skin is warm and dry.  Psychiatric: She has a normal mood and affect.    ED Course  Procedures (including critical care time) Labs Review Labs Reviewed  PREGNANCY, URINE  URINALYSIS, ROUTINE W REFLEX MICROSCOPIC   Imaging Review No results found.  EKG Interpretation   None       MDM  No diagnosis found. Mr. female presents with left flank pain.  On exam she is awake alert, afebrile, clearly in no distress.  Patient's evaluation is largely reassuring, though there suspicion for urinary tract infection versus kidney stone.  We discussed pros and cons of radiation exposure.  Patient was discharged in stable condition.  Absent distress, abdominal pain, vaginal or urinary complaints, there is no suspicion for imminent decompensation.  The patient is stable for discharge  Gerhard Munch, MD 08/12/13 [phone]

## 2013-08-12 NOTE — ED Notes (Signed)
Pt reports left flank pain for 3 days, denies any fever, no nausea or vomiting. nad in exam room

## 2013-08-15 LAB — URINE CULTURE: Colony Count: 75000

## 2014-07-25 ENCOUNTER — Encounter (HOSPITAL_COMMUNITY): Payer: Self-pay | Admitting: Emergency Medicine

## 2014-11-29 ENCOUNTER — Emergency Department (HOSPITAL_COMMUNITY)
Admission: EM | Admit: 2014-11-29 | Discharge: 2014-11-29 | Disposition: A | Discharge status: Home / Self Care | Payer: Medicaid Other | Attending: Emergency Medicine | Admitting: Emergency Medicine

## 2014-11-29 ENCOUNTER — Encounter (HOSPITAL_COMMUNITY): Payer: Self-pay | Admitting: Emergency Medicine

## 2014-11-29 DIAGNOSIS — G8929 Other chronic pain: Secondary | ICD-10-CM | POA: Insufficient documentation

## 2014-11-29 DIAGNOSIS — Z79899 Other long term (current) drug therapy: Secondary | ICD-10-CM | POA: Insufficient documentation

## 2014-11-29 DIAGNOSIS — Z8739 Personal history of other diseases of the musculoskeletal system and connective tissue: Secondary | ICD-10-CM | POA: Insufficient documentation

## 2014-11-29 DIAGNOSIS — R51 Headache: Secondary | ICD-10-CM | POA: Insufficient documentation

## 2014-11-29 DIAGNOSIS — Z87891 Personal history of nicotine dependence: Secondary | ICD-10-CM | POA: Insufficient documentation

## 2014-11-29 DIAGNOSIS — H6691 Otitis media, unspecified, right ear: Secondary | ICD-10-CM

## 2014-11-29 DIAGNOSIS — H6591 Unspecified nonsuppurative otitis media, right ear: Secondary | ICD-10-CM | POA: Insufficient documentation

## 2014-11-29 DIAGNOSIS — R0981 Nasal congestion: Secondary | ICD-10-CM | POA: Insufficient documentation

## 2014-11-29 MED ORDER — HYDROCODONE-ACETAMINOPHEN 5-325 MG PO TABS: 1.0000 | ORAL_TABLET | ORAL | Status: DC | PRN

## 2014-11-29 MED ORDER — AMOXICILLIN 250 MG PO CAPS
500.0000 mg | ORAL_CAPSULE | Freq: Once | ORAL | Status: AC
  Administered 2014-11-29: 500 mg via ORAL
  Filled 2014-11-29: qty 2

## 2014-11-29 MED ORDER — ACETAMINOPHEN 325 MG PO TABS
650.0000 mg | ORAL_TABLET | Freq: Once | ORAL | Status: AC
  Administered 2014-11-29: 650 mg via ORAL
  Filled 2014-11-29: qty 2

## 2014-11-29 MED ORDER — AMOXICILLIN 500 MG PO CAPS: 500.0000 mg | ORAL_CAPSULE | Freq: Three times a day (TID) | ORAL | Status: DC

## 2014-11-29 NOTE — Discharge Instructions (Signed)
Otitis Media YOUR EXAM SUGGEST INFECTION OF THE RIGHT EAR. PLEASE INCREASE FLUIDS. PLEASE USE TYLENOL EVERY 4 HOURS AND AMOXIL THREE TIMES DAILY. IF YOU REQUIRE NORCO, YOU WILL NEED TO STOP BREAST FEEDING UNTIL YOU FINISH THE NORCO MEDICATION.                                                                                                                                                                          Otitis media is redness, soreness, and puffiness (swelling) in the space just behind your eardrum (middle ear). It may be caused by allergies or infection. It often happens along with a cold. HOME CARE  Take your medicine as told. Finish it even if you start to feel better.  Only take over-the-counter or prescription medicines for pain, discomfort, or fever as told by your doctor.  Follow up with your doctor as told. GET HELP IF:  You have otitis media only in one ear, or bleeding from your nose, or both.  You notice a lump on your neck.  You are not getting better in 3-5 days.  You feel worse instead of better. GET HELP RIGHT AWAY IF:   You have pain that is not helped with medicine.  You have puffiness, redness, or pain around your ear.  You get a stiff neck.  You cannot move part of your face (paralysis).  You notice that the bone behind your ear hurts when you touch it. MAKE SURE YOU:   Understand these instructions.  Will watch your condition.  Will get help right away if you are not doing well or get worse. Document Released: 02/26/2008 Document Revised: 09/14/2013 Document Reviewed: 04/06/2013 [facility] Patient Information 2015 [employer] [place]. This information is not intended to replace advice given to you by your health care provider. Make sure you discuss any questions you have with your health care provider.

## 2014-11-29 NOTE — ED Notes (Addendum)
C/o pain to right ear.  Took OTC med (night quill) and put warm olive oil with warm wet cloth to right, with no relief.

## 2014-11-29 NOTE — ED Provider Notes (Signed)
CSN: [id number]     Arrival date & time 11/29/14  1037 History   First MD Initiated Contact with Patient 11/29/14 1224     Chief Complaint  Patient presents with  . Otalgia     (Consider location/radiation/quality/duration/timing/severity/associated sxs/prior Treatment) Patient is a 24 y.o. female presenting with ear pain. The history is provided by the patient.  Otalgia Location:  Right Behind ear:  No abnormality Quality:  Aching and throbbing Severity:  Moderate Onset quality:  Gradual Duration:  1 day Timing:  Constant Progression:  Worsening Chronicity:  New Context: not direct blow and not foreign body in ear   Relieved by:  Nothing Worsened by:  Nothing tried Ineffective treatments:  OTC medications Associated symptoms: congestion and headaches   Associated symptoms: no ear discharge   Risk factors: no recent travel and no prior ear surgery     Past Medical History  Diagnosis Date  . Chronic low back pain     left side predominant  . Tendinitis   . Arthritis   . Chronic lower back pain    Past Surgical History  Procedure Laterality Date  . Skin graft in mouth     Family History  Problem Relation Age of Onset  . Cancer Paternal Grandfather     skin cancer  . Hypertension Paternal Grandmother    History  Substance Use Topics  . Smoking status: Former Smoker    Types: Cigarettes  . Smokeless tobacco: Never Used  . Alcohol Use: No   OB History    Gravida Para Term Preterm AB TAB SAB Ectopic Multiple Living   Review of Systems  HENT: Positive for congestion and ear pain. Negative for ear discharge.   Musculoskeletal: Positive for back pain.  Neurological: Positive for headaches.  All other systems reviewed and are negative.     Allergies  Other  Home Medications   Prior to Admission medications   Medication Sig Start Date End Date Taking? Authorizing Provider  norethindrone (MICRONOR,CAMILA,ERRIN) 0.35 MG tablet Take 1  tablet (0.35 mg total) by mouth daily. 04/20/13  Yes Tilda Burrow, MD   BP 138/90 mmHg  Pulse 97  Temp(Src) 99.3 F (37.4 C) (Oral)  Resp 20  Ht  (1.549 m)  Wt 120 lb (54.432 kg)  BMI 22.69 kg/m2  SpO2 100%  LMP 11/26/2014 (Exact Date)  Breastfeeding? Yes Physical Exam  Constitutional: She is oriented to person, place, and time. She appears well-developed and well-nourished.  Non-toxic appearance.  HENT:  Head: Normocephalic.  Right Ear: External ear and ear canal normal. No drainage. No mastoid tenderness. Tympanic membrane is injected, erythematous and bulging. Tympanic membrane is not perforated. A middle ear effusion is present.  Left Ear: Tympanic membrane, external ear and ear canal normal. No drainage. No foreign bodies. No mastoid tenderness.  No middle ear effusion.  Eyes: EOM and lids are normal. Pupils are equal, round, and reactive to light.  Neck: Normal range of motion. Neck supple. Carotid bruit is not present.  Cardiovascular: Normal rate, regular rhythm, normal heart sounds, intact distal pulses and normal pulses.   Pulmonary/Chest: Breath sounds normal. No respiratory distress.  Abdominal: Soft. Bowel sounds are normal. There is no tenderness. There is no guarding.  Musculoskeletal: Normal range of motion.  Lymphadenopathy:       Head (right side): No submandibular adenopathy present.       Head (left side): No  submandibular adenopathy present.    She has no cervical adenopathy.  Neurological: She is alert and oriented to person, place, and time. She has normal strength. No cranial nerve deficit or sensory deficit.  Skin: Skin is warm and dry.  Psychiatric: She has a normal mood and affect. Her speech is normal.  Nursing note and vitals reviewed.   ED Course  Procedures (including critical care time) Labs Review Labs Reviewed - No data to display  Imaging Review No results found.   EKG Interpretation None      MDM  Exam is consistent with  otitis media. The patient will be treated with amoxicillin, and Tylenol. Patient is breast-feeding, we'll give prescription for Norco, with instructions to hold on breast-feeding if she chooses to use this medication.    Final diagnoses:  None    *I have reviewed nursing notes, vital signs, and all appropriate lab and imaging results for this patient.    Ivery QualeHobson Prabhnoor Ellenberger, PA-C 11/29/14 1302  Benjiman CoreNathan Pickering, MD 11/29/14 [phone]

## 2014-11-29 NOTE — ED Notes (Signed)
Pt c/o R side otalgia, onset yesterday.

## 2016-03-01 ENCOUNTER — Ambulatory Visit: Payer: Medicaid Other | Admitting: Podiatry

## 2016-03-01 ENCOUNTER — Encounter: Payer: Self-pay | Admitting: Podiatry

## 2016-03-01 ENCOUNTER — Ambulatory Visit (INDEPENDENT_AMBULATORY_CARE_PROVIDER_SITE_OTHER): Payer: Self-pay | Admitting: Podiatry

## 2016-03-01 ENCOUNTER — Ambulatory Visit (INDEPENDENT_AMBULATORY_CARE_PROVIDER_SITE_OTHER): Payer: Self-pay

## 2016-03-01 VITALS — BP 125/86 | HR 88 | Resp 12

## 2016-03-01 DIAGNOSIS — M2011 Hallux valgus (acquired), right foot: Secondary | ICD-10-CM

## 2016-03-01 NOTE — Patient Instructions (Signed)
Pre-Operative Instructions  Congratulations, you have decided to take an important step to improving your quality of life.  You can be assured that the doctors of [facility] will be with you every step of the way.  1. Plan to be at the surgery center/hospital at least 1 (one) hour prior to your scheduled time unless otherwise directed by the surgical center/hospital staff.  You must have a responsible adult accompany you, remain during the surgery and drive you home.  Make sure you have directions to the surgical center/hospital and know how to get there on time. 2. For hospital based surgery you will need to obtain a history and physical form from your family physician within 1 month prior to the date of surgery- we will give you a form for you primary physician.  3. We make every effort to accommodate the date you request for surgery.  There are however, times where surgery dates or times have to be moved.  We will contact you as soon as possible if a change in schedule is required.   4. No Aspirin/Ibuprofen for one week before surgery.  If you are on aspirin, any non-steroidal anti-inflammatory medications (Mobic, Aleve, Ibuprofen) you should stop taking it 7 days prior to your surgery.  You make take Tylenol  For pain prior to surgery.  5. Medications- If you are taking daily heart and blood pressure medications, seizure, reflux, allergy, asthma, anxiety, pain or diabetes medications, make sure the surgery center/hospital is aware before the day of surgery so they may notify you which medications to take or avoid the day of surgery. 6. No food or drink after midnight the night before surgery unless directed otherwise by surgical center/hospital staff. 7. No alcoholic beverages 24 hours prior to surgery.  No smoking 24 hours prior to or 24 hours after surgery. 8. Wear loose pants or shorts- loose enough to fit over bandages, boots, and casts. 9. No slip on shoes, sneakers are best. 10. Bring  your boot with you to the surgery center/hospital.  Also bring crutches or a walker if your physician has prescribed it for you.  If you do not have this equipment, it will be provided for you after surgery. 11. If you have not been contracted by the surgery center/hospital by the day before your surgery, call to confirm the date and time of your surgery. 12. Leave-time from work may vary depending on the type of surgery you have.  Appropriate arrangements should be made prior to surgery with your employer. 13. Prescriptions will be provided immediately following surgery by your doctor.  Have these filled as soon as possible after surgery and take the medication as directed. 14. Remove nail polish on the operative foot. 15. Wash the night before surgery.  The night before surgery wash the foot and leg well with the antibacterial soap provided and water paying special attention to beneath the toenails and in between the toes.  Rinse thoroughly with water and dry well with a towel.  Perform this wash unless told not to do so by your physician.  Enclosed: 1 Ice pack (please put in freezer the night before surgery)   1 Hibiclens skin cleaner   Pre-op Instructions  If you have any questions regarding the instructions, do not hesitate to call our office.  [place]: [address] [place], [place] [address] [phone]  [place]: [address] [place], [place] [address] [phone]  [place]: [address]  [place], [place] [address] [phone]   Dr.   Norman Regal DPM, Dr. Matthew Wagoner DPM, Dr. M. Todd Hyatt DPM, Dr. Titorya Stover DPM 

## 2016-03-01 NOTE — Progress Notes (Signed)
Subjective:     Patient ID: Teresa Bass, female   DOB: 04/08/1991, 25 y.o.   MRN: [id number]  HPI patient presents stating I have a painful bunion on the right foot and I know I need to get it corrected and it's making it increasingly difficult to wear shoe gear. It's been present for a while and I've tried to change shoes and I've tried to soak without relief   Review of Systems  All other systems reviewed and are negative.      Objective:   Physical Exam  Constitutional: She is oriented to person, place, and time.  Cardiovascular: Intact distal pulses.   Musculoskeletal: Normal range of motion.  Neurological: She is oriented to person, place, and time.  Skin: Skin is warm.  Nursing note and vitals reviewed.  neurovascular status intact muscle strength adequate range of motion within normal limits with patient found to have hyperostosis medial aspect first metatarsal head right with redness around the surface and slight deviation the hallux against the second toe. Patient has moderate depression of the arch and is found to have good digital perfusion and is well oriented 3     Assessment:     Structural HAV deformity right with pain    Plan:     H&P condition reviewed and x-rays reviewed. We discussed treatment options and she is opted for surgery and I've recommended a Austin-type osteotomy with pin fixation and reviewed this with her and she's can do it in August and will reappoint in July  His indicate elevation of the intermetatarsal angle between the first and second metatarsal of approximate 15

## 2016-03-01 NOTE — Progress Notes (Signed)
   Subjective:    Patient ID: Teresa Bass, female    DOB: 05/15/1991, 25 y.o.   MRN: [id number]  HPI  Chief Complaint  Patient presents with  . Bunions    ''RT BUNION IS SORE.''  PT STATED RT FOOT BUNION IS BEEN HURTING FOR ABOUT 6 MONTHS. FOOT IS WORSE WHEN AT NIGHT/WALKING. TRIED NO TREATMENT.  Review of Systems  Musculoskeletal: Positive for gait problem.       Objective:   Physical Exam        Assessment & Plan:

## 2016-04-15 ENCOUNTER — Ambulatory Visit: Payer: Self-pay | Admitting: Podiatry

## 2016-04-15 ENCOUNTER — Telehealth: Payer: Self-pay | Admitting: *Deleted

## 2016-04-15 NOTE — Telephone Encounter (Signed)
"  I need to reschedule some appointments."

## 2016-04-15 NOTE — Telephone Encounter (Signed)
"  I need to postpone my surgery.  I'm scheduled for August 1.  I want to reschedule it to December 13 or 14 and reschedule my pre-op appointment."  Dr. Charlsie Merles does surgery on Tuesdays.  "Molli Knock, can he do it December 12?"  Yes, that date is available.  Dawn will give you a call back tomorrow to schedule your pre-operative appointment.  "Okay, that will be great."  I called and left a message for [name] at [facility] to reschedule surgery to December 12.

## 2016-05-03 ENCOUNTER — Other Ambulatory Visit: Payer: Self-pay

## 2016-08-28 ENCOUNTER — Telehealth: Payer: Self-pay | Admitting: *Deleted

## 2016-08-28 ENCOUNTER — Ambulatory Visit: Payer: Self-pay | Admitting: Podiatry

## 2016-08-28 ENCOUNTER — Telehealth: Payer: Self-pay

## 2016-08-28 ENCOUNTER — Ambulatory Visit (INDEPENDENT_AMBULATORY_CARE_PROVIDER_SITE_OTHER): Payer: BLUE CROSS/BLUE SHIELD | Admitting: Podiatry

## 2016-08-28 DIAGNOSIS — M2011 Hallux valgus (acquired), right foot: Secondary | ICD-10-CM

## 2016-08-28 NOTE — Telephone Encounter (Signed)
LVM regarding no call no show for pre-op appt with surgery schedule on 09/03/16

## 2016-08-28 NOTE — Patient Instructions (Signed)
Pre-Operative Instructions  Congratulations, you have decided to take an important step to improving your quality of life.  You can be assured that the doctors of [facility] will be with you every step of the way.  1. Plan to be at the surgery center/hospital at least 1 (one) hour prior to your scheduled time unless otherwise directed by the surgical center/hospital staff.  You must have a responsible adult accompany you, remain during the surgery and drive you home.  Make sure you have directions to the surgical center/hospital and know how to get there on time. 2. For hospital based surgery you will need to obtain a history and physical form from your family physician within 1 month prior to the date of surgery- we will give you a form for you primary physician.  3. We make every effort to accommodate the date you request for surgery.  There are however, times where surgery dates or times have to be moved.  We will contact you as soon as possible if a change in schedule is required.   4. No Aspirin/Ibuprofen for one week before surgery.  If you are on aspirin, any non-steroidal anti-inflammatory medications (Mobic, Aleve, Ibuprofen) you should stop taking it 7 days prior to your surgery.  You make take Tylenol  For pain prior to surgery.  5. Medications- If you are taking daily heart and blood pressure medications, seizure, reflux, allergy, asthma, anxiety, pain or diabetes medications, make sure the surgery center/hospital is aware before the day of surgery so they may notify you which medications to take or avoid the day of surgery. 6. No food or drink after midnight the night before surgery unless directed otherwise by surgical center/hospital staff. 7. No alcoholic beverages 24 hours prior to surgery.  No smoking 24 hours prior to or 24 hours after surgery. 8. Wear loose pants or shorts- loose enough to fit over bandages, boots, and casts. 9. No slip on shoes, sneakers are best. 10. Bring  your boot with you to the surgery center/hospital.  Also bring crutches or a walker if your physician has prescribed it for you.  If you do not have this equipment, it will be provided for you after surgery. 11. If you have not been contracted by the surgery center/hospital by the day before your surgery, call to confirm the date and time of your surgery. 12. Leave-time from work may vary depending on the type of surgery you have.  Appropriate arrangements should be made prior to surgery with your employer. 13. Prescriptions will be provided immediately following surgery by your doctor.  Have these filled as soon as possible after surgery and take the medication as directed. 14. Remove nail polish on the operative foot. 15. Wash the night before surgery.  The night before surgery wash the foot and leg well with the antibacterial soap provided and water paying special attention to beneath the toenails and in between the toes.  Rinse thoroughly with water and dry well with a towel.  Perform this wash unless told not to do so by your physician.  Enclosed: 1 Ice pack (please put in freezer the night before surgery)   1 Hibiclens skin cleaner   Pre-op Instructions  If you have any questions regarding the instructions, do not hesitate to call our office.  [place]: [address] [place], [place] [address] [phone]  [place]: [address] [place], [place] [address] [phone]  [place]: [address]  , [place] [address] [phone]   Dr.   Norman Regal DPM, Dr. Matthew Wagoner DPM, Dr. M. Todd Hyatt DPM, Dr. Titorya Stover DPM 

## 2016-08-28 NOTE — Telephone Encounter (Signed)
Dr. Levada SchillingSummers states he is interviewing pt and she needs a boot. [facility] she states pt is [facility], but wanted to get her surgery boot. Informed pt she would be able to get her surgery boot after the surgery not necessary to have prior to.

## 2016-08-29 NOTE — Progress Notes (Addendum)
Subjective:     Patient ID: Teresa SinclairMadison N Bass, female   DOB: Aug 23, 1991, 25 y.o.   MRN: [id number]  HPI patient presents with bunion deformity right that's been painful and states that she wants to get it fixed and she presents with husband today   Review of Systems     Objective:   Physical Exam Neurovascular status intact negative Homans sign was noted with patient found to have large structural deformity first metatarsal head right with redness around the joint surface and pain when palpated    Assessment:     Chronic HAV deformity right that's failed to respond to wider shoes soaks without relief the H&P condition reviewed and recommended correction of deformity. Explained surgery and risk and patient understands this wants surgical intervention and at this time I allowed patient to read a consent form going over alternative treatments complications and the fact there is no guarantee of long-term healing or correction of underlying deformity. Patient wants surgery signed consent form is given all preoperative instructions and had boot dispensed with instructions on getting a used to it prior to surgery    Plan:    the plan is in the above note

## 2016-09-03 ENCOUNTER — Encounter: Payer: Self-pay | Admitting: Podiatry

## 2016-09-03 DIAGNOSIS — M2011 Hallux valgus (acquired), right foot: Secondary | ICD-10-CM | POA: Diagnosis not present

## 2016-09-04 ENCOUNTER — Telehealth: Payer: Self-pay

## 2016-09-04 NOTE — Telephone Encounter (Signed)
Spoke with pt regarding post op status, she states that she is doing well, minimal pain, she is taking pain medication prn. Advised on boot usage, ice and elevation. Denies any s/s of infection. She is to call with any questions or concerns

## 2016-09-05 ENCOUNTER — Telehealth: Payer: Self-pay | Admitting: *Deleted

## 2016-09-05 ENCOUNTER — Encounter: Payer: Self-pay | Admitting: Podiatry

## 2016-09-05 NOTE — Progress Notes (Signed)
DOS 12.12.2017 Austin Bunionectomy (cutting and moving bone) with Pin Fixation Right Foot

## 2016-09-05 NOTE — Telephone Encounter (Signed)
Pt states she has only 4 pills left and would like a refill. I told pt I could write for her to pick up tomorrow, and that she could use ibuprofen 2 tablets 3-4 times daily in between the dosing of the Percocet, not to be up on the foot more than 15 - 20 mins/hour. Pt states understanding.

## 2016-09-06 MED ORDER — OXYCODONE-ACETAMINOPHEN 10-325 MG PO TABS: 1.0000 | ORAL_TABLET | Freq: Three times a day (TID) | ORAL | 0 refills | Status: DC | PRN

## 2016-09-12 ENCOUNTER — Ambulatory Visit (INDEPENDENT_AMBULATORY_CARE_PROVIDER_SITE_OTHER): Payer: Self-pay | Admitting: Podiatry

## 2016-09-12 ENCOUNTER — Ambulatory Visit (INDEPENDENT_AMBULATORY_CARE_PROVIDER_SITE_OTHER): Payer: BLUE CROSS/BLUE SHIELD

## 2016-09-12 ENCOUNTER — Encounter: Payer: Self-pay | Admitting: Podiatry

## 2016-09-12 VITALS — Temp 98.9°F

## 2016-09-12 DIAGNOSIS — Z9889 Other specified postprocedural states: Secondary | ICD-10-CM

## 2016-09-12 DIAGNOSIS — M2011 Hallux valgus (acquired), right foot: Secondary | ICD-10-CM

## 2016-09-13 ENCOUNTER — Other Ambulatory Visit: Payer: Self-pay

## 2016-09-13 NOTE — Progress Notes (Signed)
Subjective: Teresa Bass is a 25 y.o. is seen today in office s/p right Austin bunionectomy preformed on 09/03/16. They state their pain is controlled and she is doing well. She has continued in the surgical boot. Denies any systemic complaints such as fevers, chills, nausea, vomiting. No calf pain, chest pain, shortness of breath.   Objective: General: No acute distress, AAOx3  DP/PT pulses palpable 2/4, CRT < 3 sec to all digits.  Protective sensation intact. Motor function intact.  Right foot: Incision is well coapted without any evidence of dehiscence. There is no surrounding erythema, ascending cellulitis, fluctuance, crepitus, malodor, drainage/purulence. There is mild edema around the surgical site. There is mild pain along the surgical site. Toe is in rectus position.  No other areas of tenderness to bilateral lower extremities.  No other open lesions or pre-ulcerative lesions.  No pain with calf compression, swelling, warmth, erythema.   Assessment and Plan:  Status post right foot surgery, doing well with no complications   -Treatment options discussed including all alternatives, risks, and complications -X-rays were obtained and reviewed with the patient. Hardware intact. S/p bunionectomy. No other evidence of acute fracture.  -Ice/elevation -Continue CAM boot.  -Pain medication as needed. -Monitor for any clinical signs or symptoms of infection and DVT/PE and directed to call the office immediately should any occur or go to the ER. -Follow-up in 1 week for suture removal  or sooner if any problems arise. In the meantime, encouraged to call the office with any questions, concerns, change in symptoms.   Ovid CurdMatthew Delmar Dondero, DPM

## 2016-09-20 ENCOUNTER — Ambulatory Visit (INDEPENDENT_AMBULATORY_CARE_PROVIDER_SITE_OTHER): Payer: BLUE CROSS/BLUE SHIELD | Admitting: Podiatry

## 2016-09-20 DIAGNOSIS — M2011 Hallux valgus (acquired), right foot: Secondary | ICD-10-CM

## 2016-09-20 DIAGNOSIS — Z9889 Other specified postprocedural states: Secondary | ICD-10-CM

## 2016-09-20 NOTE — Progress Notes (Signed)
Subjective:     Patient ID: Teresa Bass, female   DOB: 03-15-1991, 25 y.o.   MRN: [id number]  HPI patient presents stating she's doing excellent with her right foot with minimal discomfort and walking well   Review of Systems     Objective:   Physical Exam Neurovascular status intact muscle strength adequate with patient doing well with the right foot with wound edges well coapted good alignment noted and good structure    Assessment:     Doing well post osteotomy right first metatarsal    Plan:     Advised on anti-inflammatories physical therapy and elevation. Continue compression immobilization and reappoint again in 3 weeks and may gradually increase activity over that time

## 2016-09-23 NOTE — L&D Delivery Note (Signed)
Patient is 26 y.o. G2P1001 9861w3d admitted for SOL. S/p augmentation with pitocin and AROM. Prenatal course also complicated by no prenatal care.  UDS positive for marijuana.  Delivery Note At 1:34 AM a viable female was delivered via Vaginal, Spontaneous Delivery (Presentation: LOA).  APGAR: 8, 9; weight  pending.   Placenta status: spontaneous, intact.  Cord:  Three vessel  Anesthesia:  Epidural Episiotomy: None Lacerations:  None Suture Repair: N/A Est. Blood Loss (mL):  100  Mom to postpartum.  Baby to Couplet care / Skin to Skin.     Upon arrival patient was complete and pushing. She pushed with good maternal effort to deliver a viable female infant in cephalic, LOA position. No nuchal cord present. Baby delivered without difficulty (anterior shoulder delivered with ease), was noted to have good tone and placed on maternal abdomen for oral suctioning, drying and stimulation. Delayed cord clamping performed. Placenta delivered spontaneously with gentle cord traction. Fundus firm with massage and Pitocin. Perineum inspected and found to have no lacerations. Counts of sharps, instruments, and lap pads were all correct.   Jearld LeschMary K Keionte Swicegood DO, PGY-2 06/04/2017, 1:51 AM

## 2016-10-11 ENCOUNTER — Encounter: Payer: BLUE CROSS/BLUE SHIELD | Admitting: Podiatry

## 2016-11-04 NOTE — Progress Notes (Signed)
This encounter was created in error - please disregard.

## 2017-06-03 ENCOUNTER — Emergency Department (HOSPITAL_COMMUNITY)
Admission: EM | Admit: 2017-06-03 | Discharge: 2017-06-03 | Disposition: A | Discharge status: Home / Self Care | Payer: BLUE CROSS/BLUE SHIELD | Source: Home / Self Care | Attending: Emergency Medicine | Admitting: Emergency Medicine

## 2017-06-03 ENCOUNTER — Encounter (HOSPITAL_COMMUNITY): Payer: Self-pay | Admitting: *Deleted

## 2017-06-03 ENCOUNTER — Emergency Department (HOSPITAL_COMMUNITY): Payer: BLUE CROSS/BLUE SHIELD

## 2017-06-03 ENCOUNTER — Inpatient Hospital Stay (HOSPITAL_COMMUNITY): Payer: BLUE CROSS/BLUE SHIELD | Admitting: Anesthesiology

## 2017-06-03 ENCOUNTER — Inpatient Hospital Stay (HOSPITAL_COMMUNITY)
Admission: AD | Admit: 2017-06-03 | Discharge: 2017-06-05 | DRG: 775 | Disposition: A | Discharge status: Home / Self Care | Payer: BLUE CROSS/BLUE SHIELD | Source: Ambulatory Visit | Attending: Family Medicine | Admitting: Family Medicine

## 2017-06-03 ENCOUNTER — Encounter (HOSPITAL_COMMUNITY): Payer: Self-pay

## 2017-06-03 DIAGNOSIS — Z87891 Personal history of nicotine dependence: Secondary | ICD-10-CM

## 2017-06-03 DIAGNOSIS — Z79899 Other long term (current) drug therapy: Secondary | ICD-10-CM | POA: Insufficient documentation

## 2017-06-03 DIAGNOSIS — O0930 Supervision of pregnancy with insufficient antenatal care, unspecified trimester: Secondary | ICD-10-CM

## 2017-06-03 DIAGNOSIS — F1290 Cannabis use, unspecified, uncomplicated: Secondary | ICD-10-CM | POA: Diagnosis present

## 2017-06-03 DIAGNOSIS — Z3A38 38 weeks gestation of pregnancy: Secondary | ICD-10-CM

## 2017-06-03 DIAGNOSIS — O26893 Other specified pregnancy related conditions, third trimester: Secondary | ICD-10-CM | POA: Diagnosis present

## 2017-06-03 DIAGNOSIS — O9989 Other specified diseases and conditions complicating pregnancy, childbirth and the puerperium: Secondary | ICD-10-CM

## 2017-06-03 DIAGNOSIS — O99324 Drug use complicating childbirth: Principal | ICD-10-CM | POA: Diagnosis present

## 2017-06-03 LAB — RAPID HIV SCREEN (HIV 1/2 AB+AG)
HIV 1/2 Antibodies: NONREACTIVE
HIV-1 P24 ANTIGEN - HIV24: NONREACTIVE

## 2017-06-03 LAB — CBC
HCT: 34.7 % — ABNORMAL LOW (ref 36.0–46.0)
Hemoglobin: 12.2 g/dL (ref 12.0–15.0)
MCH: 29.3 pg (ref 26.0–34.0)
MCHC: 35.2 g/dL (ref 30.0–36.0)
MCV: 83.2 fL (ref 78.0–100.0)
PLATELETS: 218 10*3/uL (ref 150–400)
RBC: 4.17 MIL/uL (ref 3.87–5.11)
RDW: 13.6 % (ref 11.5–15.5)
WBC: 18.8 10*3/uL — AB (ref 4.0–10.5)

## 2017-06-03 LAB — URINALYSIS, ROUTINE W REFLEX MICROSCOPIC
Bilirubin Urine: NEGATIVE
Glucose, UA: NEGATIVE mg/dL
Hgb urine dipstick: NEGATIVE
Ketones, ur: NEGATIVE mg/dL
LEUKOCYTES UA: NEGATIVE
NITRITE: NEGATIVE
PH: 6 (ref 5.0–8.0)
Protein, ur: NEGATIVE mg/dL
SPECIFIC GRAVITY, URINE: 1.014 (ref 1.005–1.030)

## 2017-06-03 LAB — RAPID URINE DRUG SCREEN, HOSP PERFORMED
AMPHETAMINES: NOT DETECTED
BARBITURATES: NOT DETECTED
Benzodiazepines: NOT DETECTED
Cocaine: NOT DETECTED
OPIATES: NOT DETECTED
TETRAHYDROCANNABINOL: POSITIVE — AB

## 2017-06-03 LAB — TYPE AND SCREEN
ABO/RH(D): O POS
ANTIBODY SCREEN: NEGATIVE

## 2017-06-03 LAB — OB RESULTS CONSOLE GBS: GBS: NEGATIVE

## 2017-06-03 LAB — GROUP B STREP BY PCR: GROUP B STREP BY PCR: NEGATIVE

## 2017-06-03 MED ORDER — PHENYLEPHRINE 40 MCG/ML (10ML) SYRINGE FOR IV PUSH (FOR BLOOD PRESSURE SUPPORT)
PREFILLED_SYRINGE | INTRAVENOUS | Status: AC
  Filled 2017-06-03: qty 10

## 2017-06-03 MED ORDER — PHENYLEPHRINE 40 MCG/ML (10ML) SYRINGE FOR IV PUSH (FOR BLOOD PRESSURE SUPPORT)
80.0000 ug | PREFILLED_SYRINGE | INTRAVENOUS | Status: DC | PRN
  Filled 2017-06-03: qty 5

## 2017-06-03 MED ORDER — EPHEDRINE 5 MG/ML INJ: 10.0000 mg | INTRAVENOUS | Status: DC | PRN

## 2017-06-03 MED ORDER — LACTATED RINGERS IV SOLN
500.0000 mL | Freq: Once | INTRAVENOUS | Status: AC
  Administered 2017-06-03: 500 mL via INTRAVENOUS

## 2017-06-03 MED ORDER — FENTANYL 2.5 MCG/ML BUPIVACAINE 1/10 % EPIDURAL INFUSION (WH - ANES): 14.0000 mL/h | INTRAMUSCULAR | Status: DC | PRN

## 2017-06-03 MED ORDER — ONDANSETRON 4 MG PO TBDP
4.0000 mg | ORAL_TABLET | Freq: Once | ORAL | Status: AC
  Administered 2017-06-03: 4 mg via ORAL
  Filled 2017-06-03: qty 1

## 2017-06-03 MED ORDER — SODIUM CHLORIDE 0.9 % IV BOLUS (SEPSIS)
1000.0000 mL | Freq: Once | INTRAVENOUS | Status: AC
  Administered 2017-06-03: 1000 mL via INTRAVENOUS

## 2017-06-03 MED ORDER — OXYCODONE-ACETAMINOPHEN 5-325 MG PO TABS: 2.0000 | ORAL_TABLET | ORAL | Status: DC | PRN

## 2017-06-03 MED ORDER — LIDOCAINE HCL (PF) 1 % IJ SOLN
INTRAMUSCULAR | Status: DC | PRN
  Administered 2017-06-03 (×2): 5 mL via EPIDURAL

## 2017-06-03 MED ORDER — PHENYLEPHRINE 40 MCG/ML (10ML) SYRINGE FOR IV PUSH (FOR BLOOD PRESSURE SUPPORT): 80.0000 ug | PREFILLED_SYRINGE | INTRAVENOUS | Status: DC | PRN

## 2017-06-03 MED ORDER — LACTATED RINGERS IV SOLN: 500.0000 mL | INTRAVENOUS | Status: DC | PRN

## 2017-06-03 MED ORDER — LACTATED RINGERS IV SOLN
INTRAVENOUS | Status: DC
  Administered 2017-06-03: 125 mL/h via INTRAVENOUS
  Administered 2017-06-03: via INTRAVENOUS

## 2017-06-03 MED ORDER — OXYCODONE-ACETAMINOPHEN 5-325 MG PO TABS: 1.0000 | ORAL_TABLET | ORAL | Status: DC | PRN

## 2017-06-03 MED ORDER — DIPHENHYDRAMINE HCL 50 MG/ML IJ SOLN: 12.5000 mg | INTRAMUSCULAR | Status: DC | PRN

## 2017-06-03 MED ORDER — LIDOCAINE HCL (PF) 1 % IJ SOLN
30.0000 mL | INTRAMUSCULAR | Status: DC | PRN
  Filled 2017-06-03: qty 30

## 2017-06-03 MED ORDER — EPHEDRINE 5 MG/ML INJ
10.0000 mg | INTRAVENOUS | Status: DC | PRN
  Filled 2017-06-03: qty 2

## 2017-06-03 MED ORDER — FENTANYL 2.5 MCG/ML BUPIVACAINE 1/10 % EPIDURAL INFUSION (WH - ANES)
14.0000 mL/h | INTRAMUSCULAR | Status: DC | PRN
  Administered 2017-06-03: 14 mL/h via EPIDURAL

## 2017-06-03 MED ORDER — LACTATED RINGERS IV SOLN: 500.0000 mL | Freq: Once | INTRAVENOUS | Status: DC

## 2017-06-03 MED ORDER — ACETAMINOPHEN 325 MG PO TABS
650.0000 mg | ORAL_TABLET | ORAL | Status: DC | PRN
  Administered 2017-06-04: 650 mg via ORAL
  Filled 2017-06-03: qty 2

## 2017-06-03 MED ORDER — OXYTOCIN BOLUS FROM INFUSION
500.0000 mL | Freq: Once | INTRAVENOUS | Status: AC
  Administered 2017-06-04: 500 mL via INTRAVENOUS

## 2017-06-03 MED ORDER — ONDANSETRON HCL 4 MG/2ML IJ SOLN: 4.0000 mg | Freq: Four times a day (QID) | INTRAMUSCULAR | Status: DC | PRN

## 2017-06-03 MED ORDER — SOD CITRATE-CITRIC ACID 500-334 MG/5ML PO SOLN: 30.0000 mL | ORAL | Status: DC | PRN

## 2017-06-03 MED ORDER — OXYTOCIN 40 UNITS IN LACTATED RINGERS INFUSION - SIMPLE MED
2.5000 [IU]/h | INTRAVENOUS | Status: DC
  Filled 2017-06-03: qty 1000

## 2017-06-03 MED ORDER — FENTANYL 2.5 MCG/ML BUPIVACAINE 1/10 % EPIDURAL INFUSION (WH - ANES)
INTRAMUSCULAR | Status: AC
  Filled 2017-06-03: qty 100

## 2017-06-03 NOTE — Anesthesia Procedure Notes (Signed)
Epidural Patient location during procedure: OB Start time: 06/03/2017 9:25 PM End time: 06/03/2017 9:32 PM  Staffing Anesthesiologist: Kandance Yano  Preanesthetic Checklist Completed: patient identified, site marked, surgical consent, pre-op evaluation, timeout performed, IV checked, risks and benefits discussed and monitors and equipment checked  Epidural Patient position: sitting Prep: site prepped and draped and DuraPrep Patient monitoring: continuous pulse ox and blood pressure Approach: midline Location: L3-L4 Injection technique: LOR air  Needle:  Needle type: Tuohy  Needle gauge: 17 G Needle length: 9 cm and 9 Needle insertion depth: 5 cm cm Catheter type: closed end flexible Catheter size: 19 Gauge Catheter at skin depth: 10 cm Test dose: negative  Assessment Events: blood not aspirated, injection not painful, no injection resistance, negative IV test and no paresthesia

## 2017-06-03 NOTE — ED Notes (Signed)
Pt denies any prenatal care. Pt has has one child with term birth that was induced because of low amniotic fluid. Denies any discharge or bleeding.

## 2017-06-03 NOTE — Anesthesia Preprocedure Evaluation (Addendum)
Anesthesia Evaluation  Patient identified by MRN, date of birth, ID band Patient awake    Reviewed: Allergy & Precautions, H&P , NPO status , Patient's Chart, lab work & pertinent test results, reviewed documented beta blocker date and time   Airway Mallampati: I  TM Distance: >3 FB Neck ROM: full    Dental no notable dental hx.    Pulmonary neg pulmonary ROS, former smoker,    Pulmonary exam normal breath sounds clear to auscultation       Cardiovascular negative cardio ROS Normal cardiovascular exam Rhythm:regular Rate:Normal     Neuro/Psych negative neurological ROS  negative psych ROS   GI/Hepatic negative GI ROS, Neg liver ROS,   Endo/Other  negative endocrine ROS  Renal/GU negative Renal ROS  negative genitourinary   Musculoskeletal   Abdominal   Peds  Hematology negative hematology ROS (+)   Anesthesia Other Findings   Reproductive/Obstetrics (+) Pregnancy                            Anesthesia Physical Anesthesia Plan  ASA: III  Anesthesia Plan: Epidural   Post-op Pain Management:    Induction:   PONV Risk Score and Plan:   Airway Management Planned:   Additional Equipment:   Intra-op Plan:   Post-operative Plan:   Informed Consent: I have reviewed the patients History and Physical, chart, labs and discussed the procedure including the risks, benefits and alternatives for the proposed anesthesia with the patient or authorized representative who has indicated his/her understanding and acceptance.     Plan Discussed with:   Anesthesia Plan Comments:        Anesthesia Quick Evaluation

## 2017-06-03 NOTE — H&P (Signed)
Teresa Bass is a 26 y.o. female presenting for SOL. No prenatal care. Was seen in [facility] ED earlier today, not in labor. Ctxs started when she got home. US today: EGA 4948w2d. OB History    Gravida Para Term Preterm AB Living   2 1 1     1    SAB TAB Ectopic Multiple Live Births           1     Past Medical History:  Diagnosis Date  . Arthritis   . Chronic low back pain    left side predominant  . Chronic lower back pain   . Tendinitis    Past Surgical History:  Procedure Laterality Date  . skin graft in mouth     Family History: family history includes Cancer in her paternal grandfather; Hypertension in her paternal grandmother. Social History:  reports that she has quit smoking. Her smoking use included Cigarettes. She has never used smokeless tobacco. She reports that she does not drink alcohol or use drugs.     Maternal Diabetes: No Genetic Screening: Declined Maternal Ultrasounds/Referrals: Declined Fetal Ultrasounds or other Referrals:  None Maternal Substance Abuse:  No Significant Maternal Medications:  None Significant Maternal Lab Results:  None Other Comments:  None  ROS History Dilation: 6 Effacement (%): 90 Station: -1 Exam by:: VERONICA, RN Blood pressure (!) 127/58, pulse 95, temperature 97.8 F (36.6 C), temperature source Oral, SpO2 100 %. Maternal Exam:  Uterine Assessment: Contraction strength is firm.  Contraction duration is 90 seconds. Contraction frequency is regular.   Abdomen: Patient reports no abdominal tenderness. Fundal height is term.   Estimated fetal weight is 6.5#.   Fetal presentation: vertex     Physical Exam  Constitutional: She is oriented to person, place, and time. She appears well-developed and well-nourished.  HENT:  Head: Normocephalic and atraumatic.  Right Ear: External ear normal.  Left Ear: External ear normal.  Cardiovascular: Normal rate.   Respiratory: Effort normal. No respiratory distress.  GI: Soft. She  exhibits no distension. There is no tenderness. There is no rebound and no guarding.  Musculoskeletal: Normal range of motion.  Neurological: She is alert and oriented to person, place, and time.  Skin: Skin is warm and dry.  Psychiatric: She has a normal mood and affect. Her behavior is normal. Judgment and thought content normal.    Prenatal labs: ABO, Rh:   Antibody:   Rubella:   RPR:    HBsAg:    HIV:    GBS:     Assessment/Plan: #Expectant management #Prenatal labs #epidural #Breast    Levie HeritageJacob J Stinson 06/03/2017, 9:09 PM

## 2017-06-03 NOTE — Progress Notes (Signed)
[facility] ED RN called with report for need of EFM for patient with no prenatal care.  By LMP patient is estimated 20 weeks.  She presents with lower ABD pain and Nausea with positive fetal movement.  Patient placed in Obix for further fetal monitoring.

## 2017-06-03 NOTE — ED Provider Notes (Signed)
[facility] Provider Note   CSN: [id number] Arrival date & time: 06/03/17  1108     History   Chief Complaint Chief Complaint  Patient presents with  . Abdominal Pain  . Possible Pregnancy    HPI Teresa Bass is a 26 y.o. female.  Pt presents to the ED today with abdominal pain.  She thinks she may be pregnant, but has not had any prenatal care.  [facility].  The pt has no idea when her last period was.  She has not had regular periods since her son was born.  The pt denies vaginal bleeding or d/c.  No f/c.  No n/v.      Past Medical History:  Diagnosis Date  . Arthritis   . Chronic low back pain    left side predominant  . Chronic lower back pain   . Tendinitis     Patient Active Problem List   Diagnosis Date Noted  . Postpartum state 04/20/2013  . IUGR (intrauterine growth restriction) 03/02/2013    Past Surgical History:  Procedure Laterality Date  . skin graft in mouth      OB History    Gravida Para Term Preterm AB Living   3 1 1     1    SAB TAB Ectopic Multiple Live Births           1       Home Medications    Prior to Admission medications   Medication Sig Start Date End Date Taking? Authorizing Provider  Prenatal Vit-Fe Fumarate-FA (PRENATAL PO) Take 1 tablet by mouth daily.   Yes [provider]    Family History Family History  Problem Relation Age of Onset  . Cancer Paternal Grandfather        skin cancer  . Hypertension Paternal Grandmother     Social History Social History  Substance Use Topics  . Smoking status: Former Smoker    Types: Cigarettes  . Smokeless tobacco: Never Used  . Alcohol use No     Allergies   Other   Review of Systems Review of Systems  Gastrointestinal: Positive for abdominal pain.  All other systems reviewed and are negative.    Physical Exam Updated Vital Signs BP (!) 129/95   Pulse 80   Temp 97.7 F (36.5 C) (Oral)    Resp 16   Ht 5\' 1"  (1.549 m)   Wt 70.6 kg (155 lb 9.6 oz)   LMP  (LMP Unknown)   SpO2 100%   Breastfeeding? No   BMI 29.40 kg/m   Physical Exam  Constitutional: She is oriented to person, place, and time. She appears well-developed and well-nourished.  HENT:  Head: Normocephalic and atraumatic.  Right Ear: External ear normal.  Left Ear: External ear normal.  Nose: Nose normal.  Mouth/Throat: Oropharynx is clear and moist.  Eyes: Pupils are equal, round, and reactive to light. Conjunctivae and EOM are normal.  Neck: Normal range of motion. Neck supple.  Cardiovascular: Normal rate, regular rhythm, normal heart sounds and intact distal pulses.   Pulmonary/Chest: Effort normal and breath sounds normal.  Abdominal: Soft.  Pt has a gravid abdomen.  Fundal height past umbilicus.  Mild suprapubic tenderness.  Genitourinary:  Genitourinary Comments: (done with a sterile glove)  Cervix is effaced.  She is about 3-4 cm dilated.  Musculoskeletal: Normal range of motion.  Neurological: She is alert and oriented to person, place, and time.  Skin: Skin is warm. Capillary refill takes less than 2 seconds.  Psychiatric: She has a normal mood and affect. Her behavior is normal. Thought content normal.  Nursing note and vitals reviewed.    ED Treatments / Results  Labs (all labs ordered are listed, but only abnormal results are displayed) Labs Reviewed  URINALYSIS, ROUTINE W REFLEX MICROSCOPIC    EKG  EKG Interpretation None       Radiology US Ob Limited  Result Date: 06/03/2017 CLINICAL DATA:  No prenatal care. EXAM: LIMITED OBSTETRIC ULTRASOUND FINDINGS: Number of Fetuses: 1 Heart Rate:  143 bpm Movement: Visualized Presentation: Cephalic Placental Location: Anterior Previa: No Amniotic Fluid (Subjective): Subjectively decreased with very little fluid visible. BPD:  9.4cm 38w  2d MATERNAL FINDINGS: Cervix:  Appears closed. Uterus/Adnexae: No abnormality visualized. IMPRESSION:  Approximately 38 week 2 day intrauterine pregnancy. Fetal heart rate 143 beats per minute. Cephalic presentation. Subjectively decreased amniotic fluid. This exam is performed on an emergent basis and does not comprehensively evaluate fetal size, dating, or anatomy; follow-up complete OB US should be considered if further fetal assessment is warranted. Electronically Signed   By: Charlett Nose M.D.   On: 06/03/2017 14:46    Procedures Procedures (including critical care time)  Medications Ordered in ED Medications  sodium chloride 0.9 % bolus 1,000 mL (0 mLs Intravenous Stopped 06/03/17 1353)     Initial Impression / Assessment and Plan / ED Course  I have reviewed the triage vital signs and the nursing notes.  Pertinent labs & imaging results that were available during my care of the patient were reviewed by me and considered in my medical decision making (see chart for details).   US shows pt to be [redacted] weeks pregnant.  She is not in active labor.  She knows to go to [facility] hospital if pain worsens or if she has bleeding or gush of fluid.  She is also welcome here if needed.    Final Clinical Impressions(s) / ED Diagnoses   Final diagnoses:  [redacted] weeks gestation of pregnancy    New Prescriptions New Prescriptions   No medications on file     Jacalyn Lefevre, MD 06/03/17 1459

## 2017-06-03 NOTE — ED Triage Notes (Signed)
Patient reports of lower abdominal pain with nausea that started this morning. Patient states she has appointment with [facility] for Monday for first prenatal visit. Patient reports of able to feel fetal movement. Unknown last period. Denies vaginal bleeding or d/c.

## 2017-06-03 NOTE — MAU Note (Signed)
PT SAYS SHE WENT TO [place] FOR  ABD PAIN -   FOUND  OUT  SHE WAS PREG-  VE 4  CM.  - VERTEX.

## 2017-06-03 NOTE — ED Notes (Signed)
Patient in bathroom when called for triage.

## 2017-06-03 NOTE — Progress Notes (Signed)
1319 Dr. Adrian BlackwaterStinson notified of this pt in ED with no PNC, no information on LMP and complaint of vaginal pressure.  Per ED RN pt denies vaginal bleeding or LOF and painful UC's.  Dr. Adrian BlackwaterStinson recommends OB limited US to establish dating and SVE by EDP.  1439 US show approx. [redacted] wk GA, vertex fetus and SVE is 4 cm.  I recommended EDP call Dr. Adrian BlackwaterStinson to determine if transfer is needed. 1526 Pt is not laboring and will be discharged by ED staff.

## 2017-06-04 ENCOUNTER — Encounter (HOSPITAL_COMMUNITY): Payer: Self-pay

## 2017-06-04 DIAGNOSIS — Z3A38 38 weeks gestation of pregnancy: Secondary | ICD-10-CM

## 2017-06-04 DIAGNOSIS — O99324 Drug use complicating childbirth: Secondary | ICD-10-CM

## 2017-06-04 DIAGNOSIS — F1290 Cannabis use, unspecified, uncomplicated: Secondary | ICD-10-CM

## 2017-06-04 LAB — COMPREHENSIVE METABOLIC PANEL
ALT: 13 U/L — AB (ref 14–54)
ANION GAP: 11 (ref 5–15)
AST: 25 U/L (ref 15–41)
Albumin: 2.8 g/dL — ABNORMAL LOW (ref 3.5–5.0)
Alkaline Phosphatase: 118 U/L (ref 38–126)
BUN: 8 mg/dL (ref 6–20)
CHLORIDE: 103 mmol/L (ref 101–111)
CO2: 20 mmol/L — ABNORMAL LOW (ref 22–32)
CREATININE: 0.68 mg/dL (ref 0.44–1.00)
Calcium: 7.8 mg/dL — ABNORMAL LOW (ref 8.9–10.3)
Glucose, Bld: 118 mg/dL — ABNORMAL HIGH (ref 65–99)
Potassium: 3.4 mmol/L — ABNORMAL LOW (ref 3.5–5.1)
Sodium: 134 mmol/L — ABNORMAL LOW (ref 135–145)
Total Bilirubin: 0.5 mg/dL (ref 0.3–1.2)
Total Protein: 5.8 g/dL — ABNORMAL LOW (ref 6.5–8.1)

## 2017-06-04 LAB — RPR: RPR Ser Ql: NONREACTIVE

## 2017-06-04 LAB — PROTEIN / CREATININE RATIO, URINE
Creatinine, Urine: 128 mg/dL
Protein Creatinine Ratio: 0.12 mg/mg{Cre} (ref 0.00–0.15)
TOTAL PROTEIN, URINE: 15 mg/dL

## 2017-06-04 LAB — ABO/RH: ABO/RH(D): O POS

## 2017-06-04 LAB — HEPATITIS B SURFACE ANTIGEN: Hepatitis B Surface Ag: NEGATIVE

## 2017-06-04 MED ORDER — ZOLPIDEM TARTRATE 5 MG PO TABS: 5.0000 mg | ORAL_TABLET | Freq: Every evening | ORAL | Status: DC | PRN

## 2017-06-04 MED ORDER — ONDANSETRON HCL 4 MG/2ML IJ SOLN: 4.0000 mg | INTRAMUSCULAR | Status: DC | PRN

## 2017-06-04 MED ORDER — WITCH HAZEL-GLYCERIN EX PADS: 1.0000 "application " | MEDICATED_PAD | CUTANEOUS | Status: DC | PRN

## 2017-06-04 MED ORDER — ACETAMINOPHEN 325 MG PO TABS: 650.0000 mg | ORAL_TABLET | ORAL | Status: DC | PRN

## 2017-06-04 MED ORDER — SIMETHICONE 80 MG PO CHEW: 80.0000 mg | CHEWABLE_TABLET | ORAL | Status: DC | PRN

## 2017-06-04 MED ORDER — TERBUTALINE SULFATE 1 MG/ML IJ SOLN: 0.2500 mg | Freq: Once | INTRAMUSCULAR | Status: DC | PRN

## 2017-06-04 MED ORDER — ONDANSETRON HCL 4 MG PO TABS: 4.0000 mg | ORAL_TABLET | ORAL | Status: DC | PRN

## 2017-06-04 MED ORDER — TERBUTALINE SULFATE 1 MG/ML IJ SOLN
0.2500 mg | Freq: Once | INTRAMUSCULAR | Status: DC | PRN
  Filled 2017-06-04: qty 1

## 2017-06-04 MED ORDER — OXYTOCIN 40 UNITS IN LACTATED RINGERS INFUSION - SIMPLE MED
1.0000 m[IU]/min | INTRAVENOUS | Status: DC
  Administered 2017-06-04: 2 m[IU]/min via INTRAVENOUS

## 2017-06-04 MED ORDER — TETANUS-DIPHTH-ACELL PERTUSSIS 5-2.5-18.5 LF-MCG/0.5 IM SUSP: 0.5000 mL | Freq: Once | INTRAMUSCULAR | Status: DC

## 2017-06-04 MED ORDER — PRENATAL MULTIVITAMIN CH
1.0000 | ORAL_TABLET | Freq: Every day | ORAL | Status: DC
  Administered 2017-06-04: 1 via ORAL
  Filled 2017-06-04: qty 1

## 2017-06-04 MED ORDER — IBUPROFEN 600 MG PO TABS
600.0000 mg | ORAL_TABLET | Freq: Four times a day (QID) | ORAL | Status: DC
  Administered 2017-06-04 – 2017-06-05 (×5): 600 mg via ORAL
  Filled 2017-06-04 (×5): qty 1

## 2017-06-04 MED ORDER — COCONUT OIL OIL: 1.0000 "application " | TOPICAL_OIL | Status: DC | PRN

## 2017-06-04 MED ORDER — DIBUCAINE 1 % RE OINT: 1.0000 "application " | TOPICAL_OINTMENT | RECTAL | Status: DC | PRN

## 2017-06-04 MED ORDER — DIPHENHYDRAMINE HCL 25 MG PO CAPS: 25.0000 mg | ORAL_CAPSULE | Freq: Four times a day (QID) | ORAL | Status: DC | PRN

## 2017-06-04 MED ORDER — OXYTOCIN 40 UNITS IN LACTATED RINGERS INFUSION - SIMPLE MED: 1.0000 m[IU]/min | INTRAVENOUS | Status: DC

## 2017-06-04 MED ORDER — BENZOCAINE-MENTHOL 20-0.5 % EX AERO
1.0000 "application " | INHALATION_SPRAY | CUTANEOUS | Status: DC | PRN
  Filled 2017-06-04: qty 56

## 2017-06-04 MED ORDER — SENNOSIDES-DOCUSATE SODIUM 8.6-50 MG PO TABS
2.0000 | ORAL_TABLET | ORAL | Status: DC
  Administered 2017-06-05: 2 via ORAL
  Filled 2017-06-04: qty 2

## 2017-06-04 NOTE — Clinical Social Work Maternal (Signed)
  CLINICAL SOCIAL WORK MATERNAL/CHILD NOTE  Patient Details  Name: Teresa Bass MRN: [id number] Date of Birth: 07/01/1991  Date:  06/04/2017  Clinical Social Worker Initiating Note:  Kerby Borner Boyd-Gilyard  Date/ Time Initiated:  05/28/17/1024     Child's Name:  Teresa Bass   Legal Guardian:  Mother (Teresa Bass 04/24/1990)   Need for Interpreter:  None   Date of Referral:  06/04/17     Reason for Referral:  Current Substance Use/Substance Use During Pregnancy , Late or No Prenatal Care    Referral Source:  Central Nursery   Address:  [address] [place] [address]  Phone number:  [phone]   Household Members:  Self, Minor Children, Significant Other, Parents (MOB's oldest child is Teresa Bass 03/12/13)   Natural Supports (not living in the home):  Parent (FOB's and MOB's parents will provide support. )   Professional Supports: None   Employment: Full-time   Type of Work: CNA   Education:  High school graduate   Financial Resources:  Private Insurance   Other Resources:      Cultural/Religious Considerations Which May Impact Care:  none reported   Strengths:  Ability to meet basic needs , Home prepared for child , Pediatrician chosen    Risk Factors/Current Problems:  Substance Use    Cognitive State:  Alert , Able to Concentrate , Linear Thinking    Mood/Affect:  Happy , Bright , Calm , Interested , Comfortable    CSW Assessment: CSW met with MOB to complete an assessment for hx of substance use.  When CSW arrived, MOB was attaching and bonding with infant as evident by MOB engaging in skin to skin.  MOB gave CSW permission to complete the assessment while FOB was present.  MOB was inviting and interested in meeting with CSW.  CSW inquired about MOB's substance use and MOB acknowledged the use of marijuana during pregnancy.  MOB denied the use of all other illicit substance.  MOB reported MOB's last use of marijuana was "about a month ago".   CSW offered MOB SA resources and MOB declined.  CSW informed MOB of the hospital's policy and procedures regarding perinatal SA. MOB was made aware of the 2 drug screenings for the infant.  MOB was understanding and did not have any concerns. CSW informed MOB that the infant's UDS is positive and CSW will be making a report to [place] CPS.  MOB was understanding and denied CPS hx. MOB was also informed that CSW will monitor infant's CDS and will report the results to CPS.   CSW inquired about MOB's NPNC and MOB reported MOB recently found out she was pregnant (3 weeks ago) and was unable to take off work for PN appointments.  CSW assessed for barriers for infant follow-up and MOB denied barriers.   CSW provided SIDS and PPD education.  CSW thanked MOB for meeting with CSW and provided MOB with CSW's contact information.  CSW made a CPS report with [place] CPS, Whitney Duncan.  CPS will follow-up with MOB within 72 hours.   CSW Plan/Description:  Patient/Family Education , No Further Intervention Required/No Barriers to Discharge, Child Protective Service Report  (CSW will also provide CPS with infant's CDS results. )   Jevaughn Degollado Boyd-Gilyard, MSW, LCSW Clinical Social Work [phone]   Ben Habermann D BOYD-GILYARD, LCSW 06/04/2017, 3:34 PM 

## 2017-06-04 NOTE — Progress Notes (Signed)
Teresa Bass is a 26 y.o. G2P1001 at 7068w3d admitted for SOL.   Subjective: Comfortable with epidural.  No concerns.  Objective: BP 125/78   Pulse 77   Temp 98.1 F (36.7 C) (Oral)   Resp 18   Ht 5\' 1"  (1.549 m)   Wt 70.3 kg (155 lb)   LMP  (LMP Unknown)   SpO2 100%   BMI 29.29 kg/m  No intake/output data recorded. No intake/output data recorded.  FHT:  Baseline 150, moderate variability, +accels, no decels UC:   q2-4 min SVE:   Dilation: 7.5 Effacement (%): 90 Station: +1 Exam by:: SunocoMary Meleny Tregoning, DO [facility](Marley Charlot, DO)  Labs: Lab Results  Component Value Date   WBC 18.8 (H) 06/03/2017   HGB 12.2 06/03/2017   HCT 34.7 (L) 06/03/2017   MCV 83.2 06/03/2017   PLT 218 06/03/2017    Assessment / Plan: SOL, progression slowed since admission and epidural  Labor: AROM performed, start pitocin and monitor Preeclampsia:  N/A Fetal Wellbeing:  Category 1 Pain Control:  Epidural I/D:  GBS negative Anticipated MOD:  NSVD  Jearld LeschMary K Rabon Scholle 06/04/2017, 12:16 AM

## 2017-06-04 NOTE — Anesthesia Postprocedure Evaluation (Signed)
Anesthesia Post Note  Patient: Teresa Bass  Procedure(s) Performed: * No procedures listed *     Patient location during evaluation: Mother Baby Anesthesia Type: Epidural Level of consciousness: awake and alert Pain management: pain level controlled Vital Signs Assessment: post-procedure vital signs reviewed and stable Respiratory status: spontaneous breathing, nonlabored ventilation and respiratory function stable Cardiovascular status: stable Postop Assessment: no headache, no backache and epidural receding Anesthetic complications: no    Last Vitals:  Vitals:   06/04/17 0315 06/04/17 0335  BP: 121/80 131/81  Pulse: 81 65  Resp:  18  Temp:  36.9 C  SpO2:      Last Pain:  Vitals:   06/04/17 0335  TempSrc: Axillary  PainSc: 0-No pain   Pain Goal:                 [name]

## 2017-06-04 NOTE — Anesthesia Postprocedure Evaluation (Signed)
Anesthesia Post Note  Patient: Meshelle N Fazzino  Procedure(s) Performed: * No procedures listed *     Patient location during evaluation: Mother Baby Anesthesia Type: Epidural Level of consciousness: awake, awake and alert and oriented Pain management: pain level controlled Vital Signs Assessment: post-procedure vital signs reviewed and stable Respiratory status: spontaneous breathing, nonlabored ventilation and respiratory function stable Cardiovascular status: stable Postop Assessment: no headache, no backache, patient able to bend at knees, no signs of nausea or vomiting and adequate PO intake Anesthetic complications: no    Last Vitals:  Vitals:   06/04/17 0435 06/04/17 0830  BP: 128/66 133/66  Pulse: 63 72  Resp: 18 18  Temp: 36.9 C 36.8 C  SpO2:      Last Pain:  Vitals:   06/04/17 0435  TempSrc: Axillary  PainSc: 0-No pain   Pain Goal:                 Gaylon Melchor

## 2017-06-04 NOTE — Addendum Note (Signed)
Addendum  created 06/04/17 1026 by Ayriel Texidor S, CRNA   Charge Capture section accepted    

## 2017-06-04 NOTE — Lactation Note (Addendum)
This note was copied from a baby's chart. Lactation Consultation Note  Patient Name: Teresa Bass Today's Date: 06/04/2017 Reason for consult: Initial assessment   Initial assessment with Exp BF mom of 14 hour old infant. Infant with 4 BF for 10-30 minutes, 1 attempt, 1 void and 1 stool in last 24 hours. Infant weight 6 lb 10.5 oz.   Mom reports she BF her 89 yo for 3 years. Mom had infant latched to right breast, infant was feeding actively. Mom had infant latched and supported well. Mom reports she has no questions/concerns at this time. Enc mom to BF infant STS 8-12 x in 24 hours at first feeding cues. Enc mom to call out for feeding assistance as needed. Mom reports she knows how to how to hand express, enc mom to hand express before latch to get milk flowing.   BF Resources handout and LC Brochure given, mom informed of IP/OP Services, BF Support Groups and LC phone #. Enc mom to call for feeding assistance as needed. Mom has a Medela PIS at home. [facility]    Maternal Data Formula Feeding for Exclusion: No Has patient been taught Hand Expression?: Yes Does the patient have breastfeeding experience prior to this delivery?: Yes  Feeding Feeding Type: Breast Fed Length of feed: 20 min  LATCH Score Latch: Grasps breast easily, tongue down, lips flanged, rhythmical sucking.  Audible Swallowing: A few with stimulation  Type of Nipple: Everted at rest and after stimulation  Comfort (Breast/Nipple): Soft / non-tender  Hold (Positioning): No assistance needed to correctly position infant at breast.  LATCH Score: 9  Interventions Interventions: Breast feeding basics reviewed;Support pillows  Lactation Tools Discussed/Used WIC Program: No   Consult Status Consult Status: Follow-up Date: 06/05/17 Follow-up type: In-patient    Silas FloodSharon S Trig Mcbryar 06/04/2017, 4:01 PM

## 2017-06-05 LAB — RUBELLA SCREEN: RUBELLA: 1.69 {index} (ref >0.99)

## 2017-06-05 MED ORDER — IBUPROFEN 600 MG PO TABS: 600.0000 mg | ORAL_TABLET | Freq: Four times a day (QID) | ORAL | 0 refills | Status: DC

## 2017-06-05 MED ORDER — ACETAMINOPHEN 325 MG PO TABS: 650.0000 mg | ORAL_TABLET | ORAL | 0 refills | Status: DC | PRN

## 2017-06-05 NOTE — Lactation Note (Addendum)
This note was copied from a baby's chart. Lactation Consultation Note  Patient Name: Teresa Georgiann MohsMadison Manetta WUJWJ'XToday's Date: 06/05/2017 Reason for consult: Follow-up assessment;Infant weight loss (4% weight loss, early D/C desired by mom ) Baby is 3034 hours old  Per mom baby cluster fed all night and has taken alittle break this am.  LC reviewed  And updated the doc flow sheets per mom and dad.  Per mom nipples are sensitive, LC encouraged to use her EBM liberally to nipples.  LC instructed mom on the use shells between feedings except when sleeping.  And LC instructed mom on the use of hand pump. Per mom the #24 F was comfortable with her 1st baby .  And she has a DEBP at home. Sore nipple and engorgement prevention and tx reviewed.  Mother informed of post-discharge support and given phone number to the lactation department, including services for phone call assistance; out-patient appointments; and breastfeeding support group. List of other breastfeeding resources in the community given in the handout. Encouraged mother to call for problems or concerns related to breastfeeding. LC in the room when Dr. Ezequiel EssexGable [facility].   At this consult baby hungry, and rooting , mom independent with latch , latch score of 10.  Multiple swallows noted. See assessment below.    Maternal Data Has patient been taught Hand Expression?: Yes (reviewed)  Feeding Feeding Type: Breast Fed Length of feed: 10 min (multiple swallows, increased with breast compressions )  LATCH Score Latch: Grasps breast easily, tongue down, lips flanged, rhythmical sucking.  Audible Swallowing: Spontaneous and intermittent  Type of Nipple: Everted at rest and after stimulation  Comfort (Breast/Nipple): Soft / non-tender  Hold (Positioning): No assistance needed to correctly position infant at breast.  LATCH Score: 10  Interventions Interventions: Breast feeding basics reviewed;Assisted with  latch;Skin to skin;Breast massage;Breast compression  Lactation Tools Discussed/Used Tools: Shells;Pump Shell Type: Inverted Breast pump type: Manual Pump Review: Setup, frequency, and cleaning Initiated by:: MAI  Date initiated:: 06/05/17   Consult Status Consult Status: Complete Date: 06/05/17    Teresa Bass 06/05/2017, 12:25 PM

## 2017-06-05 NOTE — Plan of Care (Signed)
Problem: Life Cycle: Goal: Risk for postpartum hemorrhage will decrease Outcome: Progressing About 0100 pt passed one plum size clot before voiding. Pt denied other gushes of blood or clots

## 2017-06-05 NOTE — Discharge Summary (Signed)
OB Discharge Summary     Patient Name: Teresa Bass DOB: [birth year] MRN: [id number]  Date of admission: 06/03/2017 Delivering MD: Shawna ClampBOOKER, KIMBERLY R   Date of discharge: 06/05/2017  Admitting diagnosis: 38WKS CTX Intrauterine pregnancy: 3140w3d     Secondary diagnosis:  Active Problems:   Normal labor   No prenatal care in current pregnancy  Additional problems: THC use in pregnancy     Discharge diagnosis: Term Pregnancy Delivered                                                                                                Post partum procedures:none  Augmentation: AROM and Pitocin  Complications: None  Hospital course:  Onset of Labor With Vaginal Delivery     26 y.o. yo [id number] at 7740w3d was admitted in Latent Labor on 06/03/2017. Patient had an uncomplicated labor course as follows:  Membrane Rupture Time/Date: 12:21 AM ,06/04/2017   Intrapartum Procedures: Episiotomy: None [1]                                         Lacerations:  None [1]  Patient had a delivery of a Viable infant. 06/04/2017  Information for the patient's newborn:  Teresa Bass, Boy Teresa [[id number]][[id number]]  Delivery Method: Vaginal, Spontaneous Delivery (Filed from Delivery Summary)    Pateint had an uncomplicated postpartum course.  She is ambulating, tolerating a regular diet, passing flatus, and urinating well. Patient is discharged home in stable condition on 06/05/17.   Physical exam  Vitals:   06/04/17 0435 06/04/17 0830 06/04/17 1800 06/05/17 0534  BP: 128/66 133/66 126/83 126/72  Pulse: 63 72 73 (!) 58  Resp: 18 18 19 18   Temp: 98.5 F (36.9 C) 98.2 F (36.8 C) 98 F (36.7 C) 98 F (36.7 C)  TempSrc: Axillary   Oral  SpO2:      Weight:      Height:       General: alert, cooperative and no distress Lochia: appropriate Uterine Fundus: firm Incision: N/A DVT Evaluation: No evidence of DVT seen on physical exam. No cords or calf tenderness. No significant calf/ankle edema. Labs: Lab  Results  Component Value Date   WBC 18.8 (H) 06/03/2017   HGB 12.2 06/03/2017   HCT 34.7 (L) 06/03/2017   MCV 83.2 06/03/2017   PLT 218 06/03/2017   CMP Latest Ref Rng & Units 06/04/2017  Glucose 65 - 99 mg/dL 102(V118(H)  BUN 6 - 20 mg/dL 8  Creatinine 2.530.44 - 6.641.00 mg/dL 4.030.68  Sodium 474135 - 259145 mmol/L 134(L)  Potassium 3.5 - 5.1 mmol/L 3.4(L)  Chloride 101 - 111 mmol/L 103  CO2 22 - 32 mmol/L 20(L)  Calcium 8.9 - 10.3 mg/dL 7.8(L)  Total Protein 6.5 - 8.1 g/dL 5.6(L5.8(L)  Total Bilirubin 0.3 - 1.2 mg/dL 0.5  Alkaline Phos 38 - 126 U/L 118  AST 15 - 41 U/L 25  ALT 14 - 54 U/L 13(L)    Discharge instruction: per After Visit Summary and "Baby  and Me Booklet".  After visit meds:  Allergies as of 06/05/2017      Reactions   Other    Pt states allergies to arthritis med. Unsure of name, told to update at next appt.       Medication List    TAKE these medications   acetaminophen 325 MG tablet Commonly known as:  TYLENOL Take 2 tablets (650 mg total) by mouth every 4 (four) hours as needed (for pain scale < 4).   ibuprofen 600 MG tablet Commonly known as:  ADVIL,MOTRIN Take 1 tablet (600 mg total) by mouth every 6 (six) hours.   PRENATAL PO Take 1 tablet by mouth daily.            Discharge Care Instructions        Start     Ordered   06/05/17 0000  acetaminophen (TYLENOL) 325 MG tablet  Every 4 hours PRN     06/05/17 0844   06/05/17 0000  ibuprofen (ADVIL,MOTRIN) 600 MG tablet  Every 6 hours     06/05/17 0844   06/04/17 0000  OB RESULT CONSOLE Group B Strep    Comments:  This external order was created through the Results Console.   06/04/17 0028      Diet: routine diet  Activity: Advance as tolerated. Pelvic rest for 6 weeks.   Outpatient follow up:4 weeks Follow up Appt: Future Appointments Date Time Provider Department Center  06/09/2017 3:15 PM Cheral Marker, CNM [facility]   Follow up Visit:No Follow-up on file.  Postpartum contraception:  Progesterone only pills  Newborn Data: Live born female  Birth Weight: 6 lb 10.5 oz (3019 g) APGAR: 8, 9  Baby Feeding: Breast Disposition:home with mother   06/05/2017 Lennox Solders, MD  OB FELLOW DISCHARGE ATTESTATION  I have seen and examined this patient and agree with above documentation in the resident's note.   Frederik Pear, MD OB Fellow

## 2017-06-09 ENCOUNTER — Telehealth: Payer: Self-pay | Admitting: Women's Health

## 2017-06-09 ENCOUNTER — Encounter: Payer: BLUE CROSS/BLUE SHIELD | Admitting: Women's Health

## 2017-06-09 NOTE — Telephone Encounter (Signed)
Patient called the office today to schedule her 6 weeks postpartum appointment, Patient was informed that she needed to have a blood pressure check appointment. Patient states that she did not need that and just wanted to schedule the 6 weeks.

## 2017-07-21 ENCOUNTER — Encounter: Payer: Self-pay | Admitting: *Deleted

## 2017-07-21 ENCOUNTER — Ambulatory Visit: Payer: Self-pay | Admitting: Women's Health

## 2017-08-07 ENCOUNTER — Encounter: Payer: Self-pay | Admitting: Women's Health

## 2017-08-07 ENCOUNTER — Ambulatory Visit (INDEPENDENT_AMBULATORY_CARE_PROVIDER_SITE_OTHER): Payer: BLUE CROSS/BLUE SHIELD | Admitting: Women's Health

## 2017-08-07 DIAGNOSIS — O165 Unspecified maternal hypertension, complicating the puerperium: Secondary | ICD-10-CM | POA: Insufficient documentation

## 2017-08-07 MED ORDER — AMLODIPINE BESYLATE 5 MG PO TABS: 5.0000 mg | ORAL_TABLET | Freq: Every day | ORAL | 0 refills | Status: DC

## 2017-08-07 NOTE — Progress Notes (Signed)
   POSTPARTUM VISIT Patient name: Teresa Bass MRN [id number]  Date of birth: 1991/04/17 Chief Complaint:   postpartum visit (interested in IUD)  History of Present Illness:   Teresa Bass is a 26 y.o. 232P2002 Caucasian female being seen today for a postpartum visit. She is 9 weeks postpartum following a spontaneous vaginal delivery at 38.3 gestational weeks. Anesthesia: epidural. I have fully reviewed the prenatal and intrapartum course. Pregnancy complicated by no prenatal care- didn't know she was pregnant. Postpartum course has been uncomplicated. Bleeding no bleeding. Bowel function is normal. Bladder function is normal.  Patient is sexually active. Last sexual activity: 11/9.  Contraception method is wants IUD.  [place] Postpartum Depression Screening: negative. Score 5.   Last pap ~6771yrs ago.  Results were normal .  No LMP recorded.  Baby's course has been uncomplicated. Baby is feeding by breast.  Review of Systems:   Pertinent items are noted in HPI Denies Abnormal vaginal discharge w/ itching/odor/irritation, headaches, visual changes, shortness of breath, chest pain, abdominal pain, severe nausea/vomiting, or problems with urination or bowel movements. Pertinent History Reviewed:  Reviewed past medical,surgical, obstetrical and family history.  Reviewed problem list, medications and allergies. OB History  Gravida Para Term Preterm AB Living  2 2 2     2   SAB TAB Ectopic Multiple Live Births        0 2    # Outcome Date GA Lbr Len/2nd Weight Sex Delivery Anes PTL Lv  2 Term 06/04/17 9058w3d 00:57 / 00:16 6 lb 10.5 oz (3.019 kg) M Vag-Spont EPI  LIV  1 Term 03/12/13 1432w2d 08:46 / 02:30 6 lb 0.8 oz (2.744 kg) M Vag-Spont EPI  LIV     Physical Assessment:   Vitals:   08/07/17 1129 08/07/17 1156  BP: (!) 140/100 140/90  Pulse: 85   Weight: 146 lb 8 oz (66.5 kg)   Height: 5\' 1"  (1.549 m)   Body mass index is 27.68 kg/m.       Physical Examination:   General  appearance: alert, well appearing, and in no distress  Mental status: alert, oriented to person, place, and time  Skin: warm & dry   Cardiovascular: normal heart rate noted   Respiratory: normal respiratory effort, no distress   Breasts: deferred, no complaints   Abdomen: soft, non-tender   Pelvic: VULVA: normal appearing vulva with no masses, tenderness or lesions, UTERUS: uterus is normal size, shape, consistency and nontender  Rectal: no hemorrhoids  Extremities: no edema       No results found for this or any previous visit (from the past 24 hour(s)).  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   No orders of the defined types were placed in this encounter.   Marge DuncansBooker, Paisely Brick Randall CNM, [facility] 08/07/2017 12:00 PM

## 2017-08-07 NOTE — Patient Instructions (Signed)
NO SEX UNTIL AFTER YOU GET YOUR BIRTH CONTROL   Levonorgestrel intrauterine device (IUD) What is this medicine? LEVONORGESTREL IUD (LEE voe nor jes trel) is a contraceptive (birth control) device. The device is placed inside the uterus by a healthcare professional. It is used to prevent pregnancy. This device can also be used to treat heavy bleeding that occurs during your period. This medicine may be used for other purposes; ask your health care provider or pharmacist if you have questions. COMMON BRAND NAME(S): Kyleena, LILETTA, Mirena, Skyla What should I tell my health care provider before I take this medicine? They need to know if you have any of these conditions: -abnormal Pap smear -cancer of the breast, uterus, or cervix -diabetes -endometritis -genital or pelvic infection now or in the past -have more than one sexual partner or your partner has more than one partner -heart disease -history of an ectopic or tubal pregnancy -immune system problems -IUD in place -liver disease or tumor -problems with blood clots or take blood-thinners -seizures -use intravenous drugs -uterus of unusual shape -vaginal bleeding that has not been explained -an unusual or allergic reaction to levonorgestrel, other hormones, silicone, or polyethylene, medicines, foods, dyes, or preservatives -pregnant or trying to get pregnant -breast-feeding How should I use this medicine? This device is placed inside the uterus by a health care professional. Talk to your pediatrician regarding the use of this medicine in children. Special care may be needed. Overdosage: If you think you have taken too much of this medicine contact a poison control center or emergency room at once. NOTE: This medicine is only for you. Do not share this medicine with others. What if I miss a dose? This does not apply. Depending on the brand of device you have inserted, the device will need to be replaced every 3 to 5 years if you  wish to continue using this type of birth control. What may interact with this medicine? Do not take this medicine with any of the following medications: -amprenavir -bosentan -fosamprenavir This medicine may also interact with the following medications: -aprepitant -armodafinil -barbiturate medicines for inducing sleep or treating seizures -bexarotene -boceprevir -griseofulvin -medicines to treat seizures like carbamazepine, ethotoin, felbamate, oxcarbazepine, phenytoin, topiramate -modafinil -pioglitazone -rifabutin -rifampin -rifapentine -some medicines to treat HIV infection like atazanavir, efavirenz, indinavir, lopinavir, nelfinavir, tipranavir, ritonavir -St. John's wort -warfarin This list may not describe all possible interactions. Give your health care provider a list of all the medicines, herbs, non-prescription drugs, or dietary supplements you use. Also tell them if you smoke, drink alcohol, or use illegal drugs. Some items may interact with your medicine. What should I watch for while using this medicine? Visit your doctor or health care professional for regular check ups. See your doctor if you or your partner has sexual contact with others, becomes HIV positive, or gets a sexual transmitted disease. This product does not protect you against HIV infection (AIDS) or other sexually transmitted diseases. You can check the placement of the IUD yourself by reaching up to the top of your vagina with clean fingers to feel the threads. Do not pull on the threads. It is a good habit to check placement after each menstrual period. Call your doctor right away if you feel more of the IUD than just the threads or if you cannot feel the threads at all. The IUD may come out by itself. You may become pregnant if the device comes out. If you notice that the IUD has come out   use a backup birth control method like condoms and call your health care provider. Using tampons will not change the  position of the IUD and are okay to use during your period. This IUD can be safely scanned with magnetic resonance imaging (MRI) only under specific conditions. Before you have an MRI, tell your healthcare provider that you have an IUD in place, and which type of IUD you have in place. What side effects may I notice from receiving this medicine? Side effects that you should report to your doctor or health care professional as soon as possible: -allergic reactions like skin rash, itching or hives, swelling of the face, lips, or tongue -fever, flu-like symptoms -genital sores -high blood pressure -no menstrual period for 6 weeks during use -pain, swelling, warmth in the leg -pelvic pain or tenderness -severe or sudden headache -signs of pregnancy -stomach cramping -sudden shortness of breath -trouble with balance, talking, or walking -unusual vaginal bleeding, discharge -yellowing of the eyes or skin Side effects that usually do not require medical attention (report to your doctor or health care professional if they continue or are bothersome): -acne -breast pain -change in sex drive or performance -changes in weight -cramping, dizziness, or faintness while the device is being inserted -headache -irregular menstrual bleeding within first 3 to 6 months of use -nausea This list may not describe all possible side effects. Call your doctor for medical advice about side effects. You may report side effects to FDA at 1-800-FDA-1088. Where should I keep my medicine? This does not apply. NOTE: This sheet is a summary. It may not cover all possible information. If you have questions about this medicine, talk to your doctor, pharmacist, or health care provider.  2018 Elsevier/Gold Standard (2016-06-21 14:14:56)  

## 2017-08-13 ENCOUNTER — Telehealth: Payer: Self-pay | Admitting: *Deleted

## 2017-08-13 NOTE — Telephone Encounter (Signed)
LMOVM to return call.

## 2017-08-18 ENCOUNTER — Encounter: Payer: Self-pay | Admitting: Women's Health

## 2017-08-18 ENCOUNTER — Ambulatory Visit (INDEPENDENT_AMBULATORY_CARE_PROVIDER_SITE_OTHER): Payer: BLUE CROSS/BLUE SHIELD | Admitting: Women's Health

## 2017-08-18 ENCOUNTER — Other Ambulatory Visit (HOSPITAL_COMMUNITY)
Admission: RE | Admit: 2017-08-18 | Discharge: 2017-08-18 | Disposition: A | Discharge status: Home / Self Care | Payer: BLUE CROSS/BLUE SHIELD | Source: Ambulatory Visit | Attending: Obstetrics & Gynecology | Admitting: Obstetrics & Gynecology

## 2017-08-18 VITALS — BP 130/78 | HR 80 | Ht 61.0 in | Wt 151.0 lb

## 2017-08-18 DIAGNOSIS — Z01419 Encounter for gynecological examination (general) (routine) without abnormal findings: Secondary | ICD-10-CM | POA: Insufficient documentation

## 2017-08-18 DIAGNOSIS — O165 Unspecified maternal hypertension, complicating the puerperium: Secondary | ICD-10-CM

## 2017-08-18 NOTE — Patient Instructions (Signed)
NO SEX UNTIL AFTER YOU GET YOUR BIRTH CONTROL   Levonorgestrel intrauterine device (IUD) What is this medicine? LEVONORGESTREL IUD (LEE voe nor jes trel) is a contraceptive (birth control) device. The device is placed inside the uterus by a healthcare professional. It is used to prevent pregnancy. This device can also be used to treat heavy bleeding that occurs during your period. This medicine may be used for other purposes; ask your health care provider or pharmacist if you have questions. COMMON BRAND NAME(S): Kyleena, LILETTA, Mirena, Skyla What should I tell my health care provider before I take this medicine? They need to know if you have any of these conditions: -abnormal Pap smear -cancer of the breast, uterus, or cervix -diabetes -endometritis -genital or pelvic infection now or in the past -have more than one sexual partner or your partner has more than one partner -heart disease -history of an ectopic or tubal pregnancy -immune system problems -IUD in place -liver disease or tumor -problems with blood clots or take blood-thinners -seizures -use intravenous drugs -uterus of unusual shape -vaginal bleeding that has not been explained -an unusual or allergic reaction to levonorgestrel, other hormones, silicone, or polyethylene, medicines, foods, dyes, or preservatives -pregnant or trying to get pregnant -breast-feeding How should I use this medicine? This device is placed inside the uterus by a health care professional. Talk to your pediatrician regarding the use of this medicine in children. Special care may be needed. Overdosage: If you think you have taken too much of this medicine contact a poison control center or emergency room at once. NOTE: This medicine is only for you. Do not share this medicine with others. What if I miss a dose? This does not apply. Depending on the brand of device you have inserted, the device will need to be replaced every 3 to 5 years if you  wish to continue using this type of birth control. What may interact with this medicine? Do not take this medicine with any of the following medications: -amprenavir -bosentan -fosamprenavir This medicine may also interact with the following medications: -aprepitant -armodafinil -barbiturate medicines for inducing sleep or treating seizures -bexarotene -boceprevir -griseofulvin -medicines to treat seizures like carbamazepine, ethotoin, felbamate, oxcarbazepine, phenytoin, topiramate -modafinil -pioglitazone -rifabutin -rifampin -rifapentine -some medicines to treat HIV infection like atazanavir, efavirenz, indinavir, lopinavir, nelfinavir, tipranavir, ritonavir -St. John's wort -warfarin This list may not describe all possible interactions. Give your health care provider a list of all the medicines, herbs, non-prescription drugs, or dietary supplements you use. Also tell them if you smoke, drink alcohol, or use illegal drugs. Some items may interact with your medicine. What should I watch for while using this medicine? Visit your doctor or health care professional for regular check ups. See your doctor if you or your partner has sexual contact with others, becomes HIV positive, or gets a sexual transmitted disease. This product does not protect you against HIV infection (AIDS) or other sexually transmitted diseases. You can check the placement of the IUD yourself by reaching up to the top of your vagina with clean fingers to feel the threads. Do not pull on the threads. It is a good habit to check placement after each menstrual period. Call your doctor right away if you feel more of the IUD than just the threads or if you cannot feel the threads at all. The IUD may come out by itself. You may become pregnant if the device comes out. If you notice that the IUD has come out   use a backup birth control method like condoms and call your health care provider. Using tampons will not change the  position of the IUD and are okay to use during your period. This IUD can be safely scanned with magnetic resonance imaging (MRI) only under specific conditions. Before you have an MRI, tell your healthcare provider that you have an IUD in place, and which type of IUD you have in place. What side effects may I notice from receiving this medicine? Side effects that you should report to your doctor or health care professional as soon as possible: -allergic reactions like skin rash, itching or hives, swelling of the face, lips, or tongue -fever, flu-like symptoms -genital sores -high blood pressure -no menstrual period for 6 weeks during use -pain, swelling, warmth in the leg -pelvic pain or tenderness -severe or sudden headache -signs of pregnancy -stomach cramping -sudden shortness of breath -trouble with balance, talking, or walking -unusual vaginal bleeding, discharge -yellowing of the eyes or skin Side effects that usually do not require medical attention (report to your doctor or health care professional if they continue or are bothersome): -acne -breast pain -change in sex drive or performance -changes in weight -cramping, dizziness, or faintness while the device is being inserted -headache -irregular menstrual bleeding within first 3 to 6 months of use -nausea This list may not describe all possible side effects. Call your doctor for medical advice about side effects. You may report side effects to FDA at 1-800-FDA-1088. Where should I keep my medicine? This does not apply. NOTE: This sheet is a summary. It may not cover all possible information. If you have questions about this medicine, talk to your doctor, pharmacist, or health care provider.  2018 Elsevier/Gold Standard (2016-06-21 14:14:56)  

## 2017-08-18 NOTE — Progress Notes (Signed)
   WELL-WOMAN EXAMINATION Patient name: Teresa Bass MRN [id number]  Date of birth: Jul 30, 1991 Chief Complaint:   Gynecologic Exam  History of Present Illness:   Teresa Bass is a 26 y.o. 532P2002 Caucasian female being seen today for a routine well-woman exam. She had SVB on 06/04/17. Breastfeeding. Scheduled for IUD 12/3. Last sex 11/9. Started on norvasc 5mg  daily for PP HTN on 11/15.  Current complaints: none  PCP: none      does not desire labs No LMP recorded. The current method of family planning is abstinence Last pap >626yrs ago. Results were: normal Last mammogram: never. Results were: n/a Last colonoscopy: never. Results were: n/a  Review of Systems:   Pertinent items are noted in HPI Denies any headaches, blurred vision, fatigue, shortness of breath, chest pain, abdominal pain, abnormal vaginal discharge/itching/odor/irritation, problems with periods, bowel movements, urination, or intercourse unless otherwise stated above. Pertinent History Reviewed:  Reviewed past medical,surgical, social and family history.  Reviewed problem list, medications and allergies. Physical Assessment:   Vitals:   08/18/17 1211  BP: 130/78  Pulse: 80  Weight: 151 lb (68.5 kg)  Height: 5\' 1"  (1.549 m)  Body mass index is 28.53 kg/m.        Physical Examination:   General appearance - well appearing, and in no distress  Mental status - alert, oriented to person, place, and time  Psych:  She has a normal mood and affect  Skin - warm and dry, normal color, no suspicious lesions noted  Chest - effort normal, all lung fields clear to auscultation bilaterally  Heart - normal rate and regular rhythm  Neck:  midline trachea, no thyromegaly or nodules  Breasts - breasts appear normal, no suspicious masses, no skin or nipple changes or  axillary nodes  Abdomen - soft, nontender, nondistended, no masses or organomegaly  Pelvic - VULVA: normal appearing vulva with no masses, tenderness or  lesions  VAGINA: normal appearing vagina with normal color and discharge, no lesions  CERVIX: normal appearing cervix without discharge or lesions, no CMT  Thin prep pap is done w/ reflex HR HPV cotesting  UTERUS: uterus is felt to be normal size, shape, consistency and nontender   ADNEXA: No adnexal masses or tenderness noted.  Extremities:  No swelling or varicosities noted  No results found for this or any previous visit (from the past 24 hour(s)).  Assessment & Plan:  1) Well-Woman Exam  2) 2.805mths s/p SVB  3) Breastfeeding  4) PP HTN- well controlled on norvasc 5mg  daily, stop taking 2d prior to IUD insertion  Labs/procedures today: pap  Mammogram @25yo  or sooner if problems Colonoscopy @25yo  or sooner if problems  No orders of the defined types were placed in this encounter.  Follow-up: Return for As scheduled 12/3 for IUD., abstinence until insertion  Marge DuncansBooker, Advay Volante Randall CNM, [facility] 08/18/2017 12:52 PM

## 2017-08-19 LAB — CYTOLOGY - PAP
Chlamydia: NEGATIVE
DIAGNOSIS: NEGATIVE
Neisseria Gonorrhea: NEGATIVE

## 2017-08-25 ENCOUNTER — Ambulatory Visit (INDEPENDENT_AMBULATORY_CARE_PROVIDER_SITE_OTHER): Payer: BLUE CROSS/BLUE SHIELD | Admitting: Women's Health

## 2017-08-25 ENCOUNTER — Encounter: Payer: Self-pay | Admitting: Women's Health

## 2017-08-25 ENCOUNTER — Other Ambulatory Visit: Payer: Self-pay

## 2017-08-25 VITALS — BP 124/80 | HR 80 | Ht 61.0 in | Wt 150.0 lb

## 2017-08-25 DIAGNOSIS — Z3202 Encounter for pregnancy test, result negative: Secondary | ICD-10-CM | POA: Diagnosis not present

## 2017-08-25 DIAGNOSIS — Z30430 Encounter for insertion of intrauterine contraceptive device: Secondary | ICD-10-CM

## 2017-08-25 LAB — POCT URINE PREGNANCY: PREG TEST UR: NEGATIVE

## 2017-08-25 MED ORDER — LEVONORGESTREL 20 MCG/24HR IU IUD
INTRAUTERINE_SYSTEM | Freq: Once | INTRAUTERINE | Status: AC
  Administered 2017-08-25: 12:00:00 via INTRAUTERINE

## 2017-08-25 NOTE — Progress Notes (Signed)
   IUD INSERTION Patient name: Teresa SinclairMadison N Efaw MRN [id number]  Date of birth: June 23, 1991 Subjective Findings:   Teresa Bass is a 26 y.o. 562P2002 Caucasian female being seen today for insertion of a Mirena IUD. She is s/p SVB on 06/04/17. She was on norvasc 5mg  daily for PP HTN. She stopped taking 2d ago as directed. BP today normal.   Patient's last menstrual period was 08/08/2017. Last sexual intercourse was 08/01/17 Last pap11/26/18. Results were:  normal  The risks and benefits of the method and placement have been thouroughly reviewed with the patient and all questions were answered.  Specifically the patient is aware of failure rate of 09/998, expulsion of the IUD and of possible perforation.  The patient is aware of irregular bleeding due to the method and understands the incidence of irregular bleeding diminishes with time.  Signed copy of informed consent in chart.  Pertinent History Reviewed:   Reviewed past medical,surgical, social, obstetrical and family history.  Reviewed problem list, medications and allergies. Objective Findings & Procedure:   Vitals:   08/25/17 1142  BP: 124/80  Pulse: 80  Weight: 150 lb (68 kg)  Height: 5\' 1"  (1.549 m)  Body mass index is 28.34 kg/m.  Results for orders placed or performed in visit on 08/25/17 (from the past 24 hour(s))  POCT urine pregnancy   Collection Time: 08/25/17 11:47 AM  Result Value Ref Range   Preg Test, Ur Negative Negative     Time out was performed.  A graves speculum was placed in the vagina.  The cervix was visualized, prepped using Betadine, and grasped with a single tooth tenaculum. The uterus was found to be retroflexed and it sounded to 7 cm.  Mirena IUD placed per manufacturer's recommendations. The strings were trimmed to approximately 3 cm. The patient tolerated the procedure well.   Informal transvaginal sonogram was performed and the proper placement of the IUD was verified. Assessment & Plan:   1)  Mirena IUD insertion The patient was given post procedure instructions, including signs and symptoms of infection and to check for the strings after each menses or each month, and refraining from intercourse or anything in the vagina for 3 days. She was given a Mirena care card with date IUD placed, and date IUD to be removed. She is scheduled for a f/u appointment in 4 weeks.  2) Resolved PP HTN  Orders Placed This Encounter  Procedures  . POCT urine pregnancy    Return in about 4 weeks (around 09/22/2017) for F/U.  Marge DuncansBooker, Marieliz Strang Randall CNM, [facility]NP[facility]BC 08/25/2017 12:07 PM

## 2017-08-25 NOTE — Addendum Note (Signed)
Addended by: Tish FredericksonLANCASTER, Chelcea Zahn A on: 08/25/2017 12:14 PM   Modules accepted: Orders

## 2017-08-25 NOTE — Patient Instructions (Signed)
 Nothing in vagina for 3 days (no sex, douching, tampons, etc...)  Check your strings once a month to make sure you can feel them, if you are not able to please let us know  If you develop a fever of 100.4 or more in the next few weeks, or if you develop severe abdominal pain, please let us know  Use a backup method of birth control, such as condoms, for 2 weeks    Intrauterine Device Insertion, Care After This sheet gives you information about how to care for yourself after your procedure. Your health care provider may also give you more specific instructions. If you have problems or questions, contact your health care provider. What can I expect after the procedure? After the procedure, it is common to have:  Cramps and pain in the abdomen.  Light bleeding (spotting) or heavier bleeding that is like your menstrual period. This may last for up to a few days.  Lower back pain.  Dizziness.  Headaches.  Nausea.  Follow these instructions at home:  Before resuming sexual activity, check to make sure that you can feel the IUD string(s). You should be able to feel the end of the string(s) below the opening of your cervix. If your IUD string is in place, you may resume sexual activity. ? If you had a hormonal IUD inserted more than 7 days after your most recent period started, you will need to use a backup method of birth control for 7 days after IUD insertion. Ask your health care provider whether this applies to you.  Continue to check that the IUD is still in place by feeling for the string(s) after every menstrual period, or once a month.  Take over-the-counter and prescription medicines only as told by your health care provider.  Do not drive or use heavy machinery while taking prescription pain medicine.  Keep all follow-up visits as told by your health care provider. This is important. Contact a health care provider if:  You have bleeding that is heavier or lasts longer than  a normal menstrual cycle.  You have a fever.  You have cramps or abdominal pain that get worse or do not get better with medicine.  You develop abdominal pain that is new or is not in the same area of earlier cramping and pain.  You feel lightheaded or weak.  You have abnormal or bad-smelling discharge from your vagina.  You have pain during sexual activity.  You have any of the following problems with your IUD string(s): ? The string bothers or hurts you or your sexual partner. ? You cannot feel the string. ? The string has gotten longer.  You can feel the IUD in your vagina.  You think you may be pregnant, or you miss your menstrual period.  You think you may have an STI (sexually transmitted infection). Get help right away if:  You have flu-like symptoms.  You have a fever and chills.  You can feel that your IUD has slipped out of place. Summary  After the procedure, it is common to have cramps and pain in the abdomen. It is also common to have light bleeding (spotting) or heavier bleeding that is like your menstrual period.  Continue to check that the IUD is still in place by feeling for the string(s) after every menstrual period, or once a month.  Keep all follow-up visits as told by your health care provider. This is important.  Contact your health care provider if   you have problems with your IUD string(s), such as the string getting longer or bothering you or your sexual partner. This information is not intended to replace advice given to you by your health care provider. Make sure you discuss any questions you have with your health care provider. Document Released: 05/08/2011 Document Revised: 07/31/2016 Document Reviewed: 07/31/2016 Elsevier Interactive Patient Education  2017 Elsevier [employer]  Levonorgestrel intrauterine device (IUD) What is this medicine? LEVONORGESTREL IUD (LEE voe nor jes trel) is a contraceptive (birth control) device. The device is placed  inside the uterus by a healthcare professional. It is used to prevent pregnancy. This device can also be used to treat heavy bleeding that occurs during your period. This medicine may be used for other purposes; ask your health care provider or pharmacist if you have questions. COMMON BRAND NAME(S): Kyleena, LILETTA, Mirena, Skyla What should I tell my health care provider before I take this medicine? They need to know if you have any of these conditions: -abnormal Pap smear -cancer of the breast, uterus, or cervix -diabetes -endometritis -genital or pelvic infection now or in the past -have more than one sexual partner or your partner has more than one partner -heart disease -history of an ectopic or tubal pregnancy -immune system problems -IUD in place -liver disease or tumor -problems with blood clots or take blood-thinners -seizures -use intravenous drugs -uterus of unusual shape -vaginal bleeding that has not been explained -an unusual or allergic reaction to levonorgestrel, other hormones, silicone, or polyethylene, medicines, foods, dyes, or preservatives -pregnant or trying to get pregnant -breast-feeding How should I use this medicine? This device is placed inside the uterus by a health care professional. Talk to your pediatrician regarding the use of this medicine in children. Special care may be needed. Overdosage: If you think you have taken too much of this medicine contact a poison control center or emergency room at once. NOTE: This medicine is only for you. Do not share this medicine with others. What if I miss a dose? This does not apply. Depending on the brand of device you have inserted, the device will need to be replaced every 3 to 5 years if you wish to continue using this type of birth control. What may interact with this medicine? Do not take this medicine with any of the following medications: -amprenavir -bosentan -fosamprenavir This medicine may also  interact with the following medications: -aprepitant -armodafinil -barbiturate medicines for inducing sleep or treating seizures -bexarotene -boceprevir -griseofulvin -medicines to treat seizures like carbamazepine, ethotoin, felbamate, oxcarbazepine, phenytoin, topiramate -modafinil -pioglitazone -rifabutin -rifampin -rifapentine -some medicines to treat HIV infection like atazanavir, efavirenz, indinavir, lopinavir, nelfinavir, tipranavir, ritonavir -St. John's wort -warfarin This list may not describe all possible interactions. Give your health care provider a list of all the medicines, herbs, non-prescription drugs, or dietary supplements you use. Also tell them if you smoke, drink alcohol, or use illegal drugs. Some items may interact with your medicine. What should I watch for while using this medicine? Visit your doctor or health care professional for regular check ups. See your doctor if you or your partner has sexual contact with others, becomes HIV positive, or gets a sexual transmitted disease. This product does not protect you against HIV infection (AIDS) or other sexually transmitted diseases. You can check the placement of the IUD yourself by reaching up to the top of your vagina with clean fingers to feel the threads. Do not pull on the threads. It is a good habit   to check placement after each menstrual period. Call your doctor right away if you feel more of the IUD than just the threads or if you cannot feel the threads at all. The IUD may come out by itself. You may become pregnant if the device comes out. If you notice that the IUD has come out use a backup birth control method like condoms and call your health care provider. Using tampons will not change the position of the IUD and are okay to use during your period. This IUD can be safely scanned with magnetic resonance imaging (MRI) only under specific conditions. Before you have an MRI, tell your healthcare provider that  you have an IUD in place, and which type of IUD you have in place. What side effects may I notice from receiving this medicine? Side effects that you should report to your doctor or health care professional as soon as possible: -allergic reactions like skin rash, itching or hives, swelling of the face, lips, or tongue -fever, flu-like symptoms -genital sores -high blood pressure -no menstrual period for 6 weeks during use -pain, swelling, warmth in the leg -pelvic pain or tenderness -severe or sudden headache -signs of pregnancy -stomach cramping -sudden shortness of breath -trouble with balance, talking, or walking -unusual vaginal bleeding, discharge -yellowing of the eyes or skin Side effects that usually do not require medical attention (report to your doctor or health care professional if they continue or are bothersome): -acne -breast pain -change in sex drive or performance -changes in weight -cramping, dizziness, or faintness while the device is being inserted -headache -irregular menstrual bleeding within first 3 to 6 months of use -nausea This list may not describe all possible side effects. Call your doctor for medical advice about side effects. You may report side effects to FDA at 1-800-FDA-1088. Where should I keep my medicine? This does not apply. NOTE: This sheet is a summary. It may not cover all possible information. If you have questions about this medicine, talk to your doctor, pharmacist, or health care provider.  2018 Elsevier/Gold Standard (2016-06-21 14:14:56)   

## 2017-09-22 ENCOUNTER — Ambulatory Visit: Payer: BLUE CROSS/BLUE SHIELD | Admitting: Women's Health

## 2018-05-07 ENCOUNTER — Other Ambulatory Visit: Payer: Self-pay

## 2018-05-07 ENCOUNTER — Emergency Department (HOSPITAL_COMMUNITY)
Admission: EM | Admit: 2018-05-07 | Discharge: 2018-05-07 | Disposition: A | Discharge status: Home / Self Care | Payer: BLUE CROSS/BLUE SHIELD | Attending: Emergency Medicine | Admitting: Emergency Medicine

## 2018-05-07 ENCOUNTER — Encounter (HOSPITAL_COMMUNITY): Payer: Self-pay | Admitting: Emergency Medicine

## 2018-05-07 DIAGNOSIS — Z87891 Personal history of nicotine dependence: Secondary | ICD-10-CM | POA: Insufficient documentation

## 2018-05-07 DIAGNOSIS — R238 Other skin changes: Secondary | ICD-10-CM | POA: Insufficient documentation

## 2018-05-07 MED ORDER — ANTIPYRINE-BENZOCAINE 5.4-1.4 % OT SOLN: 3.0000 [drp] | OTIC | 0 refills | Status: DC | PRN

## 2018-05-07 MED ORDER — NEOMYCIN-POLYMYXIN-HC 1 % OT SOLN
3.0000 [drp] | Freq: Four times a day (QID) | OTIC | Status: DC
  Administered 2018-05-07: 3 [drp] via OTIC
  Filled 2018-05-07: qty 10

## 2018-05-07 NOTE — Discharge Instructions (Signed)
You have a small pimple in your outer ear canal but no sign of and abscess that would need drainage.  Apply the ear drops given - 3 drops in the right ear every 6 hours for up to a week. The prescription can also be used for pain relief.

## 2018-05-07 NOTE — ED Triage Notes (Signed)
Right ear pain x 3 weeks, noticed yellow, bloody drainage yesterday. Outer ear painful as well.

## 2018-05-08 NOTE — ED Provider Notes (Signed)
[facility] EMERGENCY DEPARTMENT Provider Note   CSN: [id number] Arrival date & time: 05/07/18  1219     History   Chief Complaint Chief Complaint  Patient presents with  . Otalgia    HPI Chapman MossMadison N Desilets is a 27 y.o. female.  The history is provided by the patient.  Otalgia  This is a new problem. The current episode started more than 1 week ago. There is pain in the right ear. The problem occurs constantly. The problem has not changed since onset.There has been no fever. The pain is moderate. Associated symptoms include ear discharge. Pertinent negatives include no rhinorrhea, no sore throat and no neck pain. Associated symptoms comments: Pt feels pressure and hearing on the right is muffled.. Her past medical history does not include chronic ear infection or hearing loss.    Past Medical History:  Diagnosis Date  . Arthritis   . Chronic low back pain    left side predominant  . Chronic lower back pain   . Tendinitis     Patient Active Problem List   Diagnosis Date Noted  . Encounter for insertion of mirena IUD 08/25/2017  . Postpartum hypertension 08/07/2017    Past Surgical History:  Procedure Laterality Date  . foot bone spur    . skin graft in mouth       OB History    Gravida  2   Para  2   Term  2   Preterm      AB      Living  2     SAB      TAB      Ectopic      Multiple  0   Live Births  2            Home Medications    Prior to Admission medications   Medication Sig Start Date End Date Taking? Authorizing Provider  amLODipine (NORVASC) 5 MG tablet Take 1 tablet (5 mg total) daily by mouth. Patient not taking: Reported on 08/25/2017 08/07/17   Cheral MarkerBooker, Kimberly R, CNM  antipyrine-benzocaine Lyla Son(AURALGAN) OTIC solution Place 3-4 drops into the right ear every 2 (two) hours as needed for ear pain. Compounded solution of 1% hydrocortisone and 2% benzocaine compounded solution. 05/07/18   Burgess AmorIdol, Leelyn Jasinski, PA-C  ipratropium (ATROVENT) 0.06  % nasal spray 2 sprays by Each Nare route Three (3) times a day prn 07/29/17 07/29/18  [provider]  Prenatal Vit-Fe Fumarate-FA (PRENATAL PO) Take 1 tablet by mouth daily.    [provider]    Family History Family History  Problem Relation Age of Onset  . Cancer Paternal Grandfather        skin cancer  . Hypertension Paternal Grandmother     Social History Social History   Tobacco Use  . Smoking status: Former Smoker    Types: Cigarettes  . Smokeless tobacco: Never Used  Substance Use Topics  . Alcohol use: No  . Drug use: No     Allergies   Naproxen   Review of Systems Review of Systems  HENT: Positive for ear discharge and ear pain. Negative for rhinorrhea and sore throat.   Musculoskeletal: Negative for neck pain.     Physical Exam Updated Vital Signs BP 129/86 (BP Location: Right Arm)   Pulse 80   Temp 98.1 F (36.7 C) (Oral)   Resp 16   Ht 5\' 2"  (1.575 m)   Wt 59.9 kg   LMP 04/14/2018  SpO2 100%   BMI 24.14 kg/m   Physical Exam  Constitutional: She is oriented to person, place, and time. She appears well-developed and well-nourished.  HENT:  Head: Normocephalic and atraumatic.  Right Ear: Tympanic membrane normal. There is swelling and tenderness. No mastoid tenderness. Tympanic membrane is not injected.  Left Ear: Tympanic membrane and ear canal normal.  Nose: Mucosal edema and rhinorrhea present.  Mouth/Throat: Uvula is midline, oropharynx is clear and moist and mucous membranes are normal. No oropharyngeal exudate, posterior oropharyngeal edema, posterior oropharyngeal erythema or tonsillar abscesses.  Small draining papule distal right ear canal.  Eyes: Conjunctivae are normal.  Cardiovascular: Normal rate.  Pulmonary/Chest: Effort normal.  Neurological: She is alert and oriented to person, place, and time.  Skin: Skin is warm and dry. No rash noted.  Psychiatric: She has a normal mood and affect.     ED Treatments /  Results  Labs (all labs ordered are listed, but only abnormal results are displayed) Labs Reviewed - No data to display  EKG None  Radiology No results found.  Procedures Procedures (including critical care time)  Medications Ordered in ED Medications - No data to display   Initial Impression / Assessment and Plan / ED Course  I have reviewed the triage vital signs and the nursing notes.  Pertinent labs & imaging results that were available during my care of the patient were reviewed by me and considered in my medical decision making (see chart for details).     Cortisporin otic solution provided, auralgan for pain relief. F/u with pcp prn if sx persist or worse. Gentle pressure using cotton swab applied to papule site with small amount of easily expressible purulence.  No abscess noted.  Final Clinical Impressions(s) / ED Diagnoses   Final diagnoses:  Papule of skin    ED Discharge Orders         Ordered    antipyrine-benzocaine Lyla Son(AURALGAN) OTIC solution  Every 2 hours PRN     05/07/18 1307           Burgess Amordol, Sylvain Hasten, PA-C 05/08/18 1022    Eber HongMiller, Brian, MD 05/11/18 1450

## 2018-05-12 IMAGING — US US OB LIMITED
1 series · 12 of 12 positions shown · non-contrast
Comparison: none

CLINICAL DATA: No prenatal care.

EXAM:
LIMITED OBSTETRIC ULTRASOUND

[Series 1: us ob limited · 0.23mm/px · 12 acquisitions, 12 frames shown]
[im 1/12]
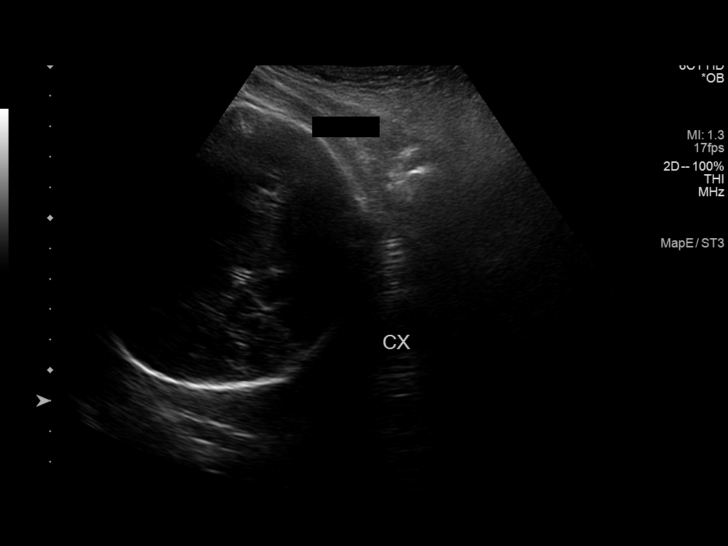
[im 2/12]
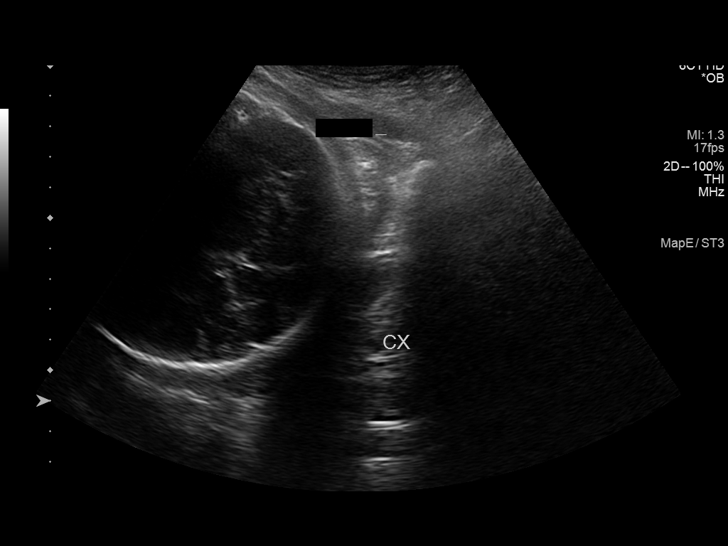
[im 3/12]
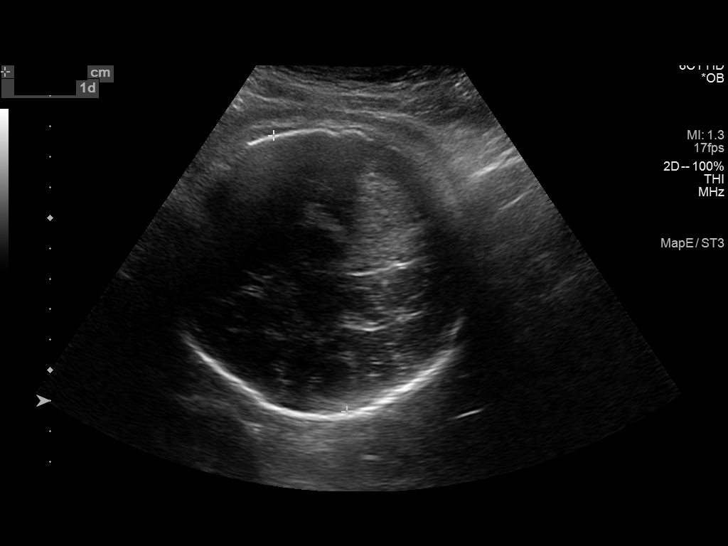
[im 4/12]
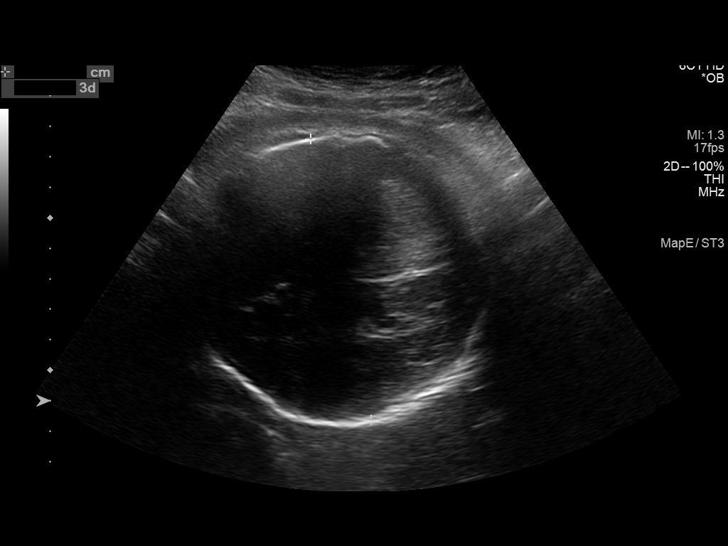
[im 5/12]
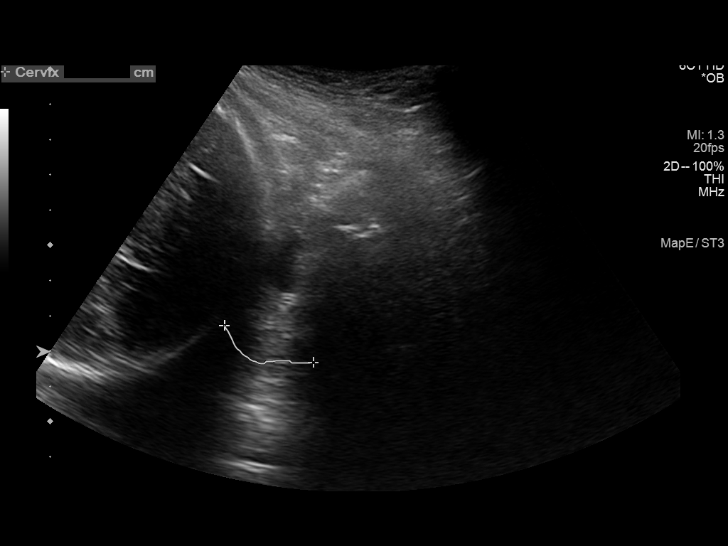
[im 6/12]
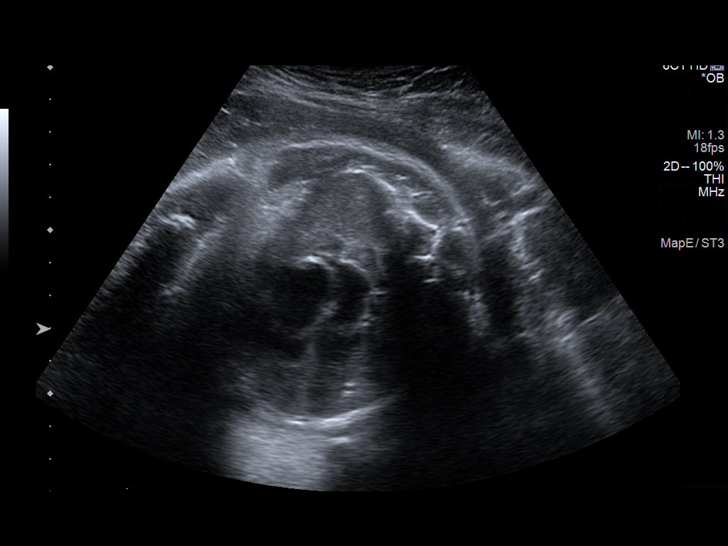
[im 7/12]
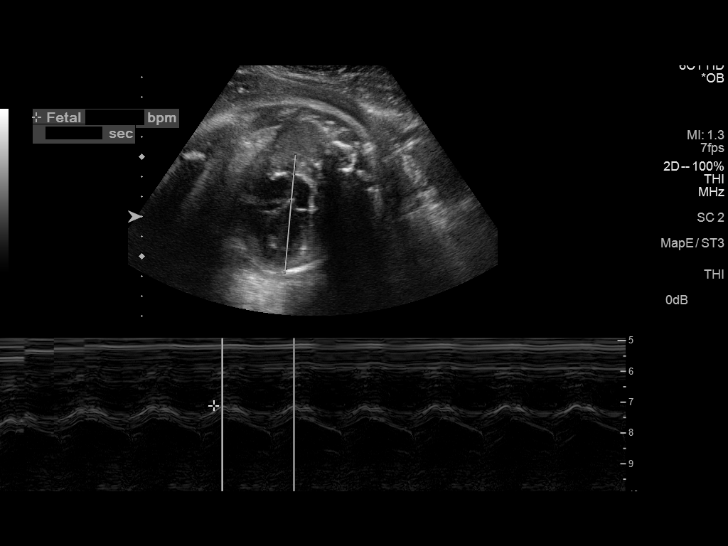
[im 8/12]
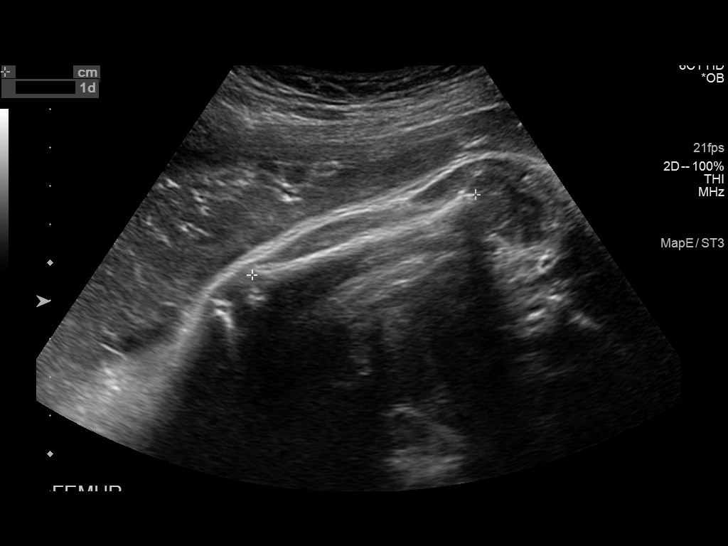
[im 9/12]
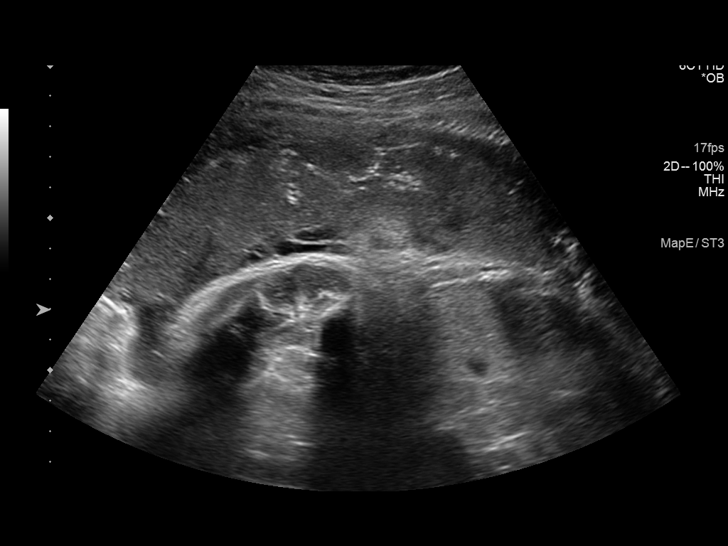
[im 10/12]
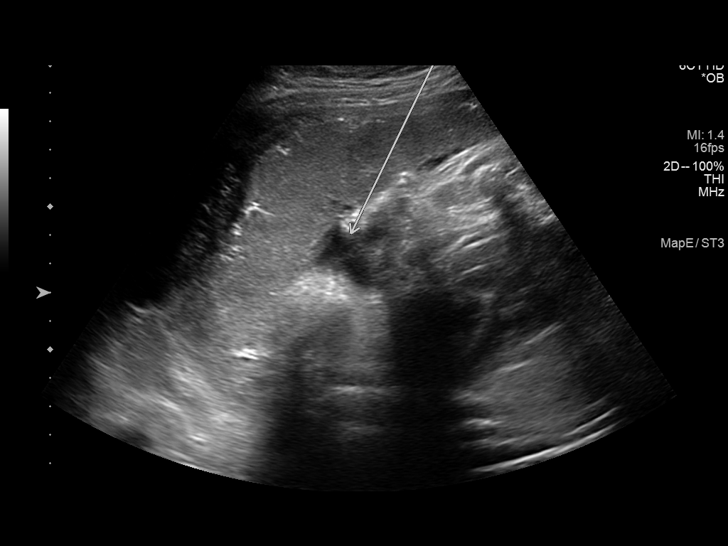
[im 11/12]
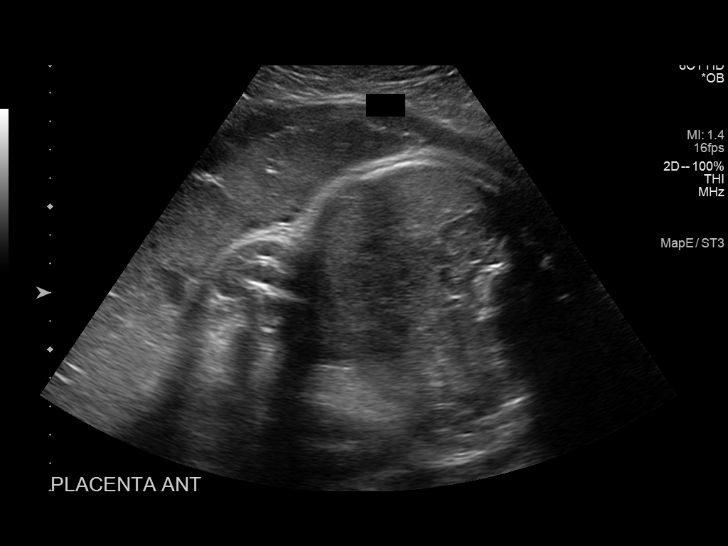
[im 12/12]
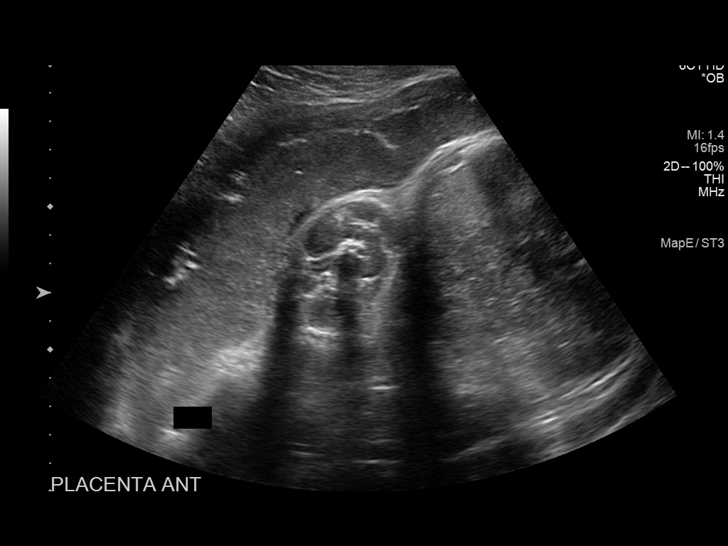

[12 of 12 positions shown; findings below may reference images not displayed]

FINDINGS: Number of Fetuses: 1

Heart Rate:  143 bpm

Movement: Visualized

Presentation: Cephalic

Placental Location: Anterior

Previa: No

Amniotic Fluid (Subjective): Subjectively decreased with very little
fluid visible.

BPD:  9.4cm 38w  2d

MATERNAL FINDINGS:

Cervix:  Appears closed.

Uterus/Adnexae: No abnormality visualized.
IMPRESSION: Approximately 38 week 2 day intrauterine pregnancy. Fetal heart rate
143 beats per minute. Cephalic presentation.

Subjectively decreased amniotic fluid.

This exam is performed on an emergent basis and does not
comprehensively evaluate fetal size, dating, or anatomy; follow-up
complete OB US should be considered if further fetal assessment is
warranted.

## 2019-08-23 ENCOUNTER — Telehealth: Payer: Self-pay | Admitting: Adult Health

## 2019-08-23 NOTE — Telephone Encounter (Signed)

## 2019-08-24 ENCOUNTER — Ambulatory Visit: Payer: Medicaid Other | Admitting: Adult Health

## 2022-12-09 DIAGNOSIS — G8929 Other chronic pain: Secondary | ICD-10-CM | POA: Insufficient documentation

## 2022-12-09 DIAGNOSIS — B009 Herpesviral infection, unspecified: Secondary | ICD-10-CM | POA: Insufficient documentation

## 2022-12-09 DIAGNOSIS — R030 Elevated blood-pressure reading, without diagnosis of hypertension: Secondary | ICD-10-CM | POA: Insufficient documentation

## 2023-07-07 DIAGNOSIS — M25862 Other specified joint disorders, left knee: Secondary | ICD-10-CM | POA: Insufficient documentation

## 2023-11-17 ENCOUNTER — Emergency Department (HOSPITAL_COMMUNITY)
Admission: EM | Admit: 2023-11-17 | Discharge: 2023-11-17 | Disposition: A | Discharge status: Home / Self Care | Payer: Medicaid Other | Attending: Emergency Medicine | Admitting: Emergency Medicine

## 2023-11-17 ENCOUNTER — Encounter (HOSPITAL_COMMUNITY): Payer: Self-pay | Admitting: *Deleted

## 2023-11-17 ENCOUNTER — Other Ambulatory Visit: Payer: Self-pay

## 2023-11-17 ENCOUNTER — Emergency Department (HOSPITAL_COMMUNITY): Payer: Medicaid Other

## 2023-11-17 DIAGNOSIS — R0789 Other chest pain: Secondary | ICD-10-CM | POA: Diagnosis present

## 2023-11-17 DIAGNOSIS — I10 Essential (primary) hypertension: Secondary | ICD-10-CM | POA: Insufficient documentation

## 2023-11-17 DIAGNOSIS — E876 Hypokalemia: Secondary | ICD-10-CM | POA: Diagnosis not present

## 2023-11-17 DIAGNOSIS — R079 Chest pain, unspecified: Secondary | ICD-10-CM

## 2023-11-17 DIAGNOSIS — R002 Palpitations: Secondary | ICD-10-CM | POA: Diagnosis not present

## 2023-11-17 LAB — COMPREHENSIVE METABOLIC PANEL WITH GFR
ALT: 18 U/L (ref 0–44)
AST: 16 U/L (ref 15–41)
Albumin: 4.2 g/dL (ref 3.5–5.0)
Alkaline Phosphatase: 50 U/L (ref 38–126)
Anion gap: 7 (ref 5–15)
BUN: 14 mg/dL (ref 6–20)
CO2: 23 mmol/L (ref 22–32)
Calcium: 9 mg/dL (ref 8.9–10.3)
Chloride: 105 mmol/L (ref 98–111)
Creatinine, Ser: 0.64 mg/dL (ref 0.44–1.00)
GFR, Estimated: >60 mL/min
Glucose, Bld: 109 mg/dL — ABNORMAL HIGH (ref 70–99)
Potassium: 3.1 mmol/L — ABNORMAL LOW (ref 3.5–5.1)
Sodium: 135 mmol/L (ref 135–145)
Total Bilirubin: 0.5 mg/dL (ref 0.0–1.2)
Total Protein: 6.8 g/dL (ref 6.5–8.1)

## 2023-11-17 LAB — CBC WITH DIFFERENTIAL/PLATELET
Abs Immature Granulocytes: 0.02 10*3/uL (ref 0.00–0.07)
Basophils Absolute: 0.1 10*3/uL (ref 0.0–0.1)
Basophils Relative: 1 %
Eosinophils Absolute: 0.1 10*3/uL (ref 0.0–0.5)
Eosinophils Relative: 2 %
HCT: 39.9 % (ref 36.0–46.0)
Hemoglobin: 13.7 g/dL (ref 12.0–15.0)
Immature Granulocytes: 0 %
Lymphocytes Relative: 27 %
Lymphs Abs: 2.2 10*3/uL (ref 0.7–4.0)
MCH: 29 pg (ref 26.0–34.0)
MCHC: 34.3 g/dL (ref 30.0–36.0)
MCV: 84.4 fL (ref 80.0–100.0)
Monocytes Absolute: 0.6 10*3/uL (ref 0.1–1.0)
Monocytes Relative: 7 %
Neutro Abs: 5.2 10*3/uL (ref 1.7–7.7)
Neutrophils Relative %: 63 %
Platelets: 272 10*3/uL (ref 150–400)
RBC: 4.73 MIL/uL (ref 3.87–5.11)
RDW: 11.4 % — ABNORMAL LOW (ref 11.5–15.5)
WBC: 8.2 10*3/uL (ref 4.0–10.5)
nRBC: 0 % (ref 0.0–0.2)

## 2023-11-17 LAB — TROPONIN I (HIGH SENSITIVITY): Troponin I (High Sensitivity): <2 ng/L

## 2023-11-17 LAB — HCG, QUANTITATIVE, PREGNANCY: hCG, Beta Chain, Quant, S: <1 m[IU]/mL

## 2023-11-17 MED ORDER — POTASSIUM CHLORIDE CRYS ER 20 MEQ PO TBCR
40.0000 meq | EXTENDED_RELEASE_TABLET | Freq: Once | ORAL | Status: AC
  Administered 2023-11-17: 40 meq via ORAL
  Filled 2023-11-17: qty 2

## 2023-11-17 MED ORDER — PROPRANOLOL HCL 10 MG PO TABS: 10.0000 mg | ORAL_TABLET | Freq: Two times a day (BID) | ORAL | 0 refills | Status: AC | PRN

## 2023-11-17 MED ORDER — POTASSIUM CHLORIDE CRYS ER 20 MEQ PO TBCR: 20.0000 meq | EXTENDED_RELEASE_TABLET | Freq: Two times a day (BID) | ORAL | 0 refills | Status: AC

## 2023-11-17 NOTE — ED Triage Notes (Signed)
 Patient arrives via [facility] from home. Per their report, pt was in bed, at rest, onset of rapid breathing and fingers tingling, reported hx of panic attacks, has not had one in a while, usually subside quickly, but this felt different. She was having left arm pain. 12 lead unremarkable. Initial HR 110, 170SPB. IV established en route. Last vitals 140 SBP, 100% RA, 80 HR. CBG 116. Pt reporting she was somewhat better.

## 2023-11-17 NOTE — ED Notes (Signed)
 Patient transported to X-ray

## 2023-11-17 NOTE — ED Triage Notes (Signed)
 Pt stating that she was watching TV when she had onset of symptoms. Now she feels left sided chest discomfort, like her "heart is expanded and woozy feeling". Hard to catch her breath.

## 2023-11-18 NOTE — ED Provider Notes (Signed)
 [place] EMERGENCY DEPARTMENT AT [facility] Provider Note   CSN: [id number] Arrival date & time: 11/17/23  4098     History  Chief Complaint  Patient presents with   Chest Pain    Teresa Bass is a 33 y.o. female.  33 year old female who presents ER today secondary to chest pain.  Patient states that she often times has chest discomfort associated with palpitations and feeling unwell.  Patient states that this will happen tonight.  She states she was sit there watching TV nice and calm and relaxed when it started.  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  Reportedly the 200s systolic.  No shortness of breath, nausea, vomiting or diaphoresis.  Patient feels well at this time.  She states she is seeing cardiology in the past for similar symptoms.  Never been on a monitor.  Never been on a blood pressure medication.  Called EMS for transport and she had tachycardia and hypertensive with them.  Rest of her vital signs within normal limits.   Chest Pain      Home Medications Prior to Admission medications   Medication Sig Start Date End Date Taking? Authorizing Provider  potassium chloride SA (KLOR-CON M) 20 MEQ tablet Take 1 tablet (20 mEq total) by mouth 2 (two) times daily for 10 days. 11/17/23 11/27/23 Yes Jaquelin Meaney, Barbara Cower, MD  propranolol (INDERAL) 10 MG tablet Take 1 tablet (10 mg total) by mouth 2 (two) times daily as needed. 11/17/23  Yes Tadashi Burkel, Barbara Cower, MD      Allergies    Naproxen    Review of Systems   Review of Systems  Cardiovascular:  Positive for chest pain.    Physical Exam Updated Vital Signs BP 119/84   Pulse 79   Temp 98.5 F (36.9 C) (Oral)   Resp 14   SpO2 97%   Breastfeeding No  Physical Exam Vitals and nursing note reviewed.  Constitutional:      Appearance: She is  well-developed.  HENT:     Head: Normocephalic and atraumatic.  Cardiovascular:     Rate and Rhythm: Normal rate and regular rhythm.  Pulmonary:     Effort: No respiratory distress.     Breath sounds: No stridor.  Abdominal:     General: There is no distension.  Musculoskeletal:     Cervical back: Normal range of motion.  Neurological:     Mental Status: She is alert.     ED Results / Procedures / Treatments   Labs (all labs ordered are listed, but only abnormal results are displayed) Labs Reviewed  CBC WITH DIFFERENTIAL/PLATELET - Abnormal; Notable for the following components:      Result Value   RDW 11.4 (*)    All other components within normal limits  COMPREHENSIVE METABOLIC PANEL - Abnormal; Notable for the following components:   Potassium 3.1 (*)    Glucose, Bld 109 (*)    All other components within normal limits  HCG, QUANTITATIVE, PREGNANCY  TROPONIN I (HIGH SENSITIVITY)    EKG EKG Interpretation Date/Time:  Monday November 17 2023 04:32:58 EST Ventricular Rate:  82 PR Interval:  150 QRS Duration:  69 QT Interval:  350 QTC Calculation: 409 R Axis:   71  Text Interpretation: Sinus rhythm Confirmed by Marily Memos [id number]) on 11/17/2023 5:35:16 AM  Radiology DG Chest 2 View Result Date: 11/17/2023 CLINICAL DATA:  Chest pain. EXAM: CHEST - 2 VIEW COMPARISON:  None Available. FINDINGS: The heart size and mediastinal contours are within normal limits. Both lungs are clear. The visualized skeletal structures are unremarkable. IMPRESSION: No evidence of acute chest disease. Electronically Signed   By: Almira Bar M.D.   On: 11/17/2023 05:45    Procedures Procedures    Medications Ordered in ED Medications  potassium chloride SA (KLOR-CON M) CR tablet 40 mEq (40 mEq Oral Given 11/17/23 0557)    ED Course/ Medical Decision Making/ A&P                                 Medical Decision Making Amount and/or Complexity of Data Reviewed Labs:  ordered. Radiology: ordered.  Risk Prescription drug management.   Asymptomatic at this time.  Workup is reassuring.  Suspect anxiety however with her high blood pressure worsening symptoms we will give her as needed propranolol which could help with anxiety, blood pressure or tachycardia.  Suggested following up with cardiology or PCP.  Also suggested keeping of both blood pressure and symptom log for further review by her primary doctor or cardiology.  Final Clinical Impression(s) / ED Diagnoses Final diagnoses:  Hypokalemia  Nonspecific chest pain  Hypertension, unspecified type    Rx / DC Orders ED Discharge Orders          Ordered    potassium chloride SA (KLOR-CON M) 20 MEQ tablet  2 times daily        11/17/23 0638    propranolol (INDERAL) 10 MG tablet  2 times daily PRN        11/17/23 0638    Ambulatory referral to Cardiology        11/17/23 1610              Clive Parcel, Barbara Cower, MD 11/18/23 [phone]

## 2023-11-25 ENCOUNTER — Ambulatory Visit: Payer: Medicaid Other

## 2023-11-25 ENCOUNTER — Ambulatory Visit

## 2023-11-25 VITALS — BP 124/88 | HR 84 | Ht 62.0 in | Wt 144.4 lb

## 2023-11-25 DIAGNOSIS — R030 Elevated blood-pressure reading, without diagnosis of hypertension: Secondary | ICD-10-CM

## 2023-11-25 DIAGNOSIS — R002 Palpitations: Secondary | ICD-10-CM

## 2023-11-25 NOTE — Patient Instructions (Addendum)
 Medication Instructions:  Your physician recommends that you continue on your current medications as directed. Please refer to the Current Medication list given to you today.  *If you need a refill on your cardiac medications before your next appointment, please call your pharmacy*   Lab Work: None Ordered If you have labs (blood work) drawn today and your tests are completely normal, you will receive your results only by: MyChart Message (if you have MyChart) OR A paper copy in the mail If you have any lab test that is abnormal or we need to change your treatment, we will call you to review the results.   Testing/Procedures: Echocardiogram An echocardiogram is a test that uses sound waves (ultrasound) to produce images of the heart. Images from an echocardiogram can provide important information about: Heart size and shape. The size and thickness and movement of your heart's walls. Heart muscle function and strength. Heart valve function or if you have stenosis. Stenosis is when the heart valves are too narrow. If blood is flowing backward through the heart valves (regurgitation). A tumor or infectious growth around the heart valves. Areas of heart muscle that are not working well because of poor blood flow or injury from a heart attack. Aneurysm detection. An aneurysm is a weak or damaged part of an artery wall. The wall bulges out from the normal force of blood pumping through the body. Tell a health care provider about: Any allergies you have. All medicines you are taking, including vitamins, herbs, eye drops, creams, and over-the-counter medicines. Any blood disorders you have. Any surgeries you have had. Any medical conditions you have. Whether you are pregnant or may be pregnant. What are the risks? Generally, this is a safe test. However, problems may occur, including an allergic reaction to dye (contrast) that may be used during the test. What happens before the test? No  specific preparation is needed. You may eat and drink normally. What happens during the test?  You will take off your clothes from the waist up and put on a hospital gown. Electrodes or electrocardiogram (ECG)patches may be placed on your chest. The electrodes or patches are then connected to a device that monitors your heart rate and rhythm. You will lie down on a table for an ultrasound exam. A gel will be applied to your chest to help sound waves pass through your skin. A handheld device, called a transducer, will be pressed against your chest and moved over your heart. The transducer produces sound waves that travel to your heart and bounce back (or "echo" back) to the transducer. These sound waves will be captured in real-time and changed into images of your heart that can be viewed on a video monitor. The images will be recorded on a computer and reviewed by your health care provider. You may be asked to change positions or hold your breath for a short time. This makes it easier to get different views or better views of your heart. In some cases, you may receive contrast through an IV in one of your veins. This can improve the quality of the pictures from your heart. The procedure may vary among health care providers and hospitals. What can I expect after the test? You may return to your normal, everyday life, including diet, activities, and medicines, unless your health care provider tells you not to do that. Follow these instructions at home: It is up to you to get the results of your test. Ask your health care provider,  or the department that is doing the test, when your results will be ready. Keep all follow-up visits. This is important. Summary An echocardiogram is a test that uses sound waves (ultrasound) to produce images of the heart. Images from an echocardiogram can provide important information about the size and shape of your heart, heart muscle function, heart valve function, and  other possible heart problems. You do not need to do anything to prepare before this test. You may eat and drink normally. After the echocardiogram is completed, you may return to your normal, everyday life, unless your health care provider tells you not to do that. This information is not intended to replace advice given to you by your health care provider. Make sure you discuss any questions you have with your health care provider. Document Revised: 05/23/2021 Document Reviewed: 05/02/2020 Elsevier Patient Education  2023 Elsevier Inc.     A zio monitor was ordered today. It will remain on for 14 days. Remove 12/09/2023. You will then return monitor and event diary in provided box. It takes 1-2 weeks for report to be downloaded and returned to [place]. We will call you with the results. If monitor falls off or has orange flashing light, please call Zio for further instructions.     Follow-Up: At [facility], you and your health needs are our priority.  As part of our continuing mission to provide you with exceptional heart care, we have created designated Provider Care Teams.  These Care Teams include your primary Cardiologist (physician) and Advanced Practice Providers (APPs -  Physician Assistants and Nurse Practitioners) who all work together to provide you with the care you need, when you need it.  We recommend signing up for the patient portal called "MyChart".  Sign up information is provided on this After Visit Summary.  MyChart is used to connect with patients for Virtual Visits (Telemedicine).  Patients are able to view lab/test results, encounter notes, upcoming appointments, etc.  Non-urgent messages can be sent to your provider as well.   To learn more about what you can do with MyChart, go to [email or web address].    Your next appointment:   Based on test results

## 2023-11-25 NOTE — Progress Notes (Signed)
 Cardiology Consultation:    Date:  11/25/2023   ID:  Teresa Bass, DOB April 24, 1991, MRN [id number]  PCP:  Trisha Mangle, FNP  Cardiologist:  Marlyn Corporal Laterria Lasota, MD   Referring MD: Marily Memos, MD   Chief Complaint  Patient presents with   Chest Pain   Palpitations          ASSESSMENT AND PLAN:   [name] 33 year old woman with past history of smoking, occasional alcohol use, gestational hypertension history, now with symptoms of palpitations and elevated blood pressures with intense episodes of palpitations leading to ER visit on February 24, shorter episode occurred once since then and using propranolol as needed at the onset of symptoms with relief.  Problem List Items Addressed This Visit     Elevated blood pressure reading   With prior history of gestational hypertension with her first pregnancy.  Elevated blood pressure readings at home ranging systolic 120s to 782N. She has documented elevated blood pressures even when she is not anxious.  PCP prescribed valsartan 80 mg which she is yet to start taking.      Palpitations - Primary   Could be related to uncontrolled blood pressure. Could be also related to ventricular or supraventricular ectopy.  She did have 1 isolated PVC noted on EKG today.  Will obtain a Zio patch for 14 days. Will obtain transthoracic echocardiogram, she lives in [place],  and will try to set up echocardiogram to be done closer to home at [facility] or in [facility].  Follow-up based on test results.      Relevant Orders   EKG 12-Lead (Completed)   ECHOCARDIOGRAM COMPLETE   LONG TERM MONITOR (3-14 DAYS)  Return to clinic based on test results.    History of Present Illness:    Teresa Bass is a 33 y.o. female who is being seen today for the evaluation of palpitations and chest discomfort leading to ER visit recently on 11-17-2023 at the request of ER physician Mesner, Barbara Cower, MD.  Her PCP is Grace Bushy,  FNP from [facility].  Pleasant woman here for the visit by herself.  Works as a Lawyer.  Lives at home with her family.  No significant longstanding health issues other than gestational hypertension requiring medications during her first pregnancy but did not require any further subsequent 2 pregnancies.  Good baseline functional status, exercises regularly and plays sports with her kids.  Recently while at home after returning from work and sitting down to watch television she had an episode of intense heartbeat which she felt was going fast and pounding out of her chest, associated with elevated blood pressures at home using an upper arm cuff, heart rates were reportedly normal, went into the ER for further evaluation at [facility] in [place].  Workup there was unremarkable other than mild hypokalemia with potassium 3.1.  She was prescribed propranolol to be used as needed.  She subsequently had visit with her PCP who started her on valsartan which she is yet to take.  Here for further assessment.  Mentions that she had another similar episode about a week ago while out shopping in [place] that was less intense and lasted for few minutes and resolved spontaneously.  She had taken propranolol on 4 different days since her ER visit on February 24.  Describes a sensation of his heavy heartbeat sensation associated with chest discomfort and at times with lightheadedness.  Denies any syncopal episodes or falls.  No limitations  with activity at home or at work.  Blood pressure log from home over the last 10 days noted fluctuating blood pressure readings with systolic ranging from 120s to 161W.  She quit smoking over a week ago.  Quit drinking alcohol. Drinks enough fluids through today to keep her hydrated. No energy drink consumption.  Family history grandmother with pacemaker and heart disease, unclear of the specifics.  EKG in the clinic today shows sinus rhythm heart rate  84/min, isolated ventricular ectopy noted.  Normal PR interval 124 ms, QRS duration normal, normal QT interval.  Blood work in the ER 11-17-2023 high-sensitivity troponin I unremarkable less than 2. Mild hypokalemia with potassium 3.1.  BUN 14 and creatinine 0.64 with EGFR greater than 60. Normal transaminases and alkaline phosphatase. CBC was unremarkable hemoglobin 13.7, hematocrit 39.9, WBC 8.2, platelets 272. She has had reviewed blood work 3-3-2025That shows normal TSH 1.4 Normal electrolytes with sodium 137, potassium 4.9, BUN 9 and creatinine 0.74 EGFR greater than 90 Magnesium 2.1 and CBC remains unremarkable.  Past Medical History:  Diagnosis Date   Arthritis    Chronic low back pain    left side predominant   Chronic lower back pain    Tendinitis     Past Surgical History:  Procedure Laterality Date   foot bone spur     ganglian cyst removal     skin graft in mouth      Current Medications: Current Meds  Medication Sig   cetirizine (ZYRTEC) 10 MG tablet Take 10 mg by mouth daily.   potassium chloride SA (KLOR-CON M) 20 MEQ tablet Take 1 tablet (20 mEq total) by mouth 2 (two) times daily for 10 days.   propranolol (INDERAL) 10 MG tablet Take 1 tablet (10 mg total) by mouth 2 (two) times daily as needed.     Allergies:   Naproxen   Social History   Socioeconomic History   Marital status: Single    Spouse name: Not on file   Number of children: Not on file   Years of education: Not on file   Highest education level: Not on file  Occupational History   Not on file  Tobacco Use   Smoking status: Former    Types: Cigarettes   Smokeless tobacco: Never  Substance and Sexual Activity   Alcohol use: No   Drug use: No   Sexual activity: Not Currently    Birth control/protection: Condom    Comment: not in over a month  Other Topics Concern   Not on file  Social History Narrative   Not on file   Social Drivers of Health   Financial Resource Strain: Not on  file  Food Insecurity: Low Risk  (11/18/2023)   Received from [facility]   Hunger Vital Sign    Worried About Running Out of Food in the Last Year: Never true    Ran Out of Food in the Last Year: Never true  Transportation Needs: No Transportation Needs (11/18/2023)   Received from [facility]    In the past 12 months, has lack of reliable transportation kept you from medical appointments, meetings, work or from getting things needed for daily living? : No  Physical Activity: Not on file  Stress: Not on file  Social Connections: Not on file     Family History: The patient's family history includes Cancer in her paternal grandfather; Hypertension in her paternal grandmother. ROS:   Please see the history of present illness.  All 14 point review of systems negative except as described per history of present illness.  EKGs/Labs/Other Studies Reviewed:    The following studies were reviewed today:   EKG:  EKG Interpretation Date/Time:  Tuesday November 25 2023 10:01:31 EST Ventricular Rate:  84 PR Interval:  124 QRS Duration:  58 QT Interval:  384 QTC Calculation: 454 R Axis:   -13  Text Interpretation: Sinus rhythm with sinus arrhythmia with occasional Premature ventricular complexes Minimal voltage criteria for LVH, may be normal variant Nonspecific T wave abnormality Abnormal ECG When compared with ECG of 17-Nov-2023 04:32, PREVIOUS ECG IS PRESENT Confirmed by Huntley Dec reddy [phone]) on 11/25/2023 10:06:36 AM    Recent Labs: 11/17/2023: ALT 18; BUN 14; Creatinine, Ser 0.64; Hemoglobin 13.7; Platelets 272; Potassium 3.1; Sodium 135  Recent Lipid Panel No results found for: "CHOL", "TRIG", "HDL", "CHOLHDL", "VLDL", "LDLCALC", "LDLDIRECT"  Physical Exam:    VS:  BP 124/88 (BP Location: Right Arm, Patient Position: Sitting)   Pulse 84   Ht 5\' 2"  (1.575 m)   Wt 144 lb 6.4 oz (65.5 kg)   SpO2 97%   BMI 26.41 kg/m     Wt Readings from Last 3  Encounters:  11/25/23 144 lb 6.4 oz (65.5 kg)  05/07/18 132 lb (59.9 kg)  08/25/17 150 lb (68 kg)     GENERAL:  Well nourished, well developed in no acute distress NECK: No JVD; No carotid bruits CARDIAC: RRR, S1 and S2 present, no murmurs, no rubs, no gallops CHEST:  Clear to auscultation without rales, wheezing or rhonchi  Extremities: No pitting pedal edema. Pulses bilaterally symmetric with radial 2+ and dorsalis pedis 2+ NEUROLOGIC:  Alert and oriented x 3  Medication Adjustments/Labs and Tests Ordered: Current medicines are reviewed at length with the patient today.  Concerns regarding medicines are outlined above.  Orders Placed This Encounter  Procedures   LONG TERM MONITOR (3-14 DAYS)   EKG 12-Lead   ECHOCARDIOGRAM COMPLETE   No orders of the defined types were placed in this encounter.   Signed, Cecille Amsterdam, MD, MPH, [facility]. 11/25/2023 10:31 AM    [facility]

## 2023-11-25 NOTE — Assessment & Plan Note (Signed)
 Could be related to uncontrolled blood pressure. Could be also related to ventricular or supraventricular ectopy.  She did have 1 isolated PVC noted on EKG today.  Will obtain a Zio patch for 14 days. Will obtain transthoracic echocardiogram, she lives in [place],  and will try to set up echocardiogram to be done closer to home at [facility] or in [facility].  Follow-up based on test results.

## 2023-11-25 NOTE — Assessment & Plan Note (Signed)
 With prior history of gestational hypertension with her first pregnancy.  Elevated blood pressure readings at home ranging systolic 120s to 161W. She has documented elevated blood pressures even when she is not anxious.  PCP prescribed valsartan 80 mg which she is yet to start taking.

## 2023-12-08 ENCOUNTER — Ambulatory Visit: Attending: Internal Medicine

## 2023-12-08 DIAGNOSIS — R002 Palpitations: Secondary | ICD-10-CM | POA: Diagnosis not present

## 2023-12-08 LAB — ECHOCARDIOGRAM COMPLETE
AR max vel: 2.89 cm2
AV Area VTI: 2.99 cm2
AV Area mean vel: 3.05 cm2
AV Mean grad: 3 mmHg
AV Peak grad: 5.1 mmHg
Ao pk vel: 1.13 m/s
Area-P 1/2: 3.48 cm2
Calc EF: 74.7 %
MV VTI: 1.87 cm2
S' Lateral: 2.5 cm
Single Plane A2C EF: 73.2 %
Single Plane A4C EF: 75.1 %

## 2023-12-16 ENCOUNTER — Telehealth: Payer: Self-pay

## 2023-12-16 NOTE — Telephone Encounter (Signed)
 Pt calling in regard to Echo results. Please advise

## 2024-01-01 DIAGNOSIS — R002 Palpitations: Secondary | ICD-10-CM
